# Patient Record
Sex: Female | Born: 1960 | Race: White | Hispanic: No | Marital: Married | Smoking: Never smoker
Health system: Southern US, Community
[De-identification: ages and names within clinical notes are randomized; demographics above are authoritative.]

## PROBLEM LIST (undated history)

## (undated) DIAGNOSIS — R112 Nausea with vomiting, unspecified: Secondary | ICD-10-CM

## (undated) DIAGNOSIS — A419 Sepsis, unspecified organism: Secondary | ICD-10-CM

## (undated) DIAGNOSIS — Z9889 Other specified postprocedural states: Secondary | ICD-10-CM

## (undated) DIAGNOSIS — Z87442 Personal history of urinary calculi: Secondary | ICD-10-CM

## (undated) DIAGNOSIS — N200 Calculus of kidney: Secondary | ICD-10-CM

## (undated) HISTORY — PX: OTHER SURGICAL HISTORY: SHX169

---

## 2004-04-03 ENCOUNTER — Ambulatory Visit: Payer: Self-pay | Admitting: Internal Medicine

## 2005-05-18 ENCOUNTER — Ambulatory Visit: Payer: Self-pay | Admitting: Unknown Physician Specialty

## 2006-05-23 ENCOUNTER — Ambulatory Visit: Payer: Self-pay | Admitting: Unknown Physician Specialty

## 2006-07-26 ENCOUNTER — Ambulatory Visit: Payer: Self-pay | Admitting: Unknown Physician Specialty

## 2007-08-18 ENCOUNTER — Ambulatory Visit: Payer: Self-pay | Admitting: Unknown Physician Specialty

## 2007-11-24 ENCOUNTER — Ambulatory Visit: Payer: Self-pay | Admitting: Urology

## 2007-12-01 ENCOUNTER — Ambulatory Visit: Payer: Self-pay | Admitting: Urology

## 2008-05-10 ENCOUNTER — Ambulatory Visit: Payer: Self-pay | Admitting: Urology

## 2008-10-08 ENCOUNTER — Ambulatory Visit: Payer: Self-pay | Admitting: Urology

## 2009-07-16 ENCOUNTER — Ambulatory Visit: Payer: Self-pay | Admitting: Unknown Physician Specialty

## 2010-09-29 ENCOUNTER — Ambulatory Visit: Payer: Self-pay | Admitting: Internal Medicine

## 2010-09-30 ENCOUNTER — Ambulatory Visit: Payer: Self-pay | Admitting: Internal Medicine

## 2012-06-20 ENCOUNTER — Ambulatory Visit: Payer: Self-pay | Admitting: Internal Medicine

## 2012-11-14 ENCOUNTER — Ambulatory Visit: Payer: Self-pay | Admitting: Internal Medicine

## 2013-04-30 ENCOUNTER — Ambulatory Visit: Payer: Self-pay | Admitting: Internal Medicine

## 2013-10-14 ENCOUNTER — Ambulatory Visit: Payer: Self-pay | Admitting: Internal Medicine

## 2014-07-16 ENCOUNTER — Other Ambulatory Visit: Payer: Self-pay | Admitting: Obstetrics and Gynecology

## 2014-07-16 DIAGNOSIS — Z1231 Encounter for screening mammogram for malignant neoplasm of breast: Secondary | ICD-10-CM

## 2014-07-30 ENCOUNTER — Ambulatory Visit
Admission: RE | Admit: 2014-07-30 | Discharge: 2014-07-30 | Disposition: A | Discharge status: Home / Self Care | Payer: 59 | Source: Ambulatory Visit | Attending: Obstetrics and Gynecology | Admitting: Obstetrics and Gynecology

## 2014-07-30 DIAGNOSIS — Z1231 Encounter for screening mammogram for malignant neoplasm of breast: Secondary | ICD-10-CM | POA: Diagnosis not present

## 2015-09-26 ENCOUNTER — Other Ambulatory Visit: Payer: Self-pay | Admitting: Internal Medicine

## 2015-09-26 DIAGNOSIS — Z1231 Encounter for screening mammogram for malignant neoplasm of breast: Secondary | ICD-10-CM

## 2015-10-10 ENCOUNTER — Other Ambulatory Visit: Payer: Self-pay | Admitting: Internal Medicine

## 2015-10-10 ENCOUNTER — Ambulatory Visit
Admission: RE | Admit: 2015-10-10 | Discharge: 2015-10-10 | Disposition: A | Discharge status: Home / Self Care | Payer: 59 | Source: Ambulatory Visit | Attending: Internal Medicine | Admitting: Internal Medicine

## 2015-10-10 DIAGNOSIS — Z1231 Encounter for screening mammogram for malignant neoplasm of breast: Secondary | ICD-10-CM

## 2016-07-02 DIAGNOSIS — I10 Essential (primary) hypertension: Secondary | ICD-10-CM | POA: Diagnosis not present

## 2016-07-02 DIAGNOSIS — E784 Other hyperlipidemia: Secondary | ICD-10-CM | POA: Diagnosis not present

## 2016-07-05 DIAGNOSIS — K219 Gastro-esophageal reflux disease without esophagitis: Secondary | ICD-10-CM | POA: Diagnosis not present

## 2016-07-09 DIAGNOSIS — L089 Local infection of the skin and subcutaneous tissue, unspecified: Secondary | ICD-10-CM | POA: Diagnosis not present

## 2016-07-09 DIAGNOSIS — S0096XA Insect bite (nonvenomous) of unspecified part of head, initial encounter: Secondary | ICD-10-CM | POA: Diagnosis not present

## 2016-07-09 DIAGNOSIS — W57XXXA Bitten or stung by nonvenomous insect and other nonvenomous arthropods, initial encounter: Secondary | ICD-10-CM | POA: Diagnosis not present

## 2016-09-28 DIAGNOSIS — B309 Viral conjunctivitis, unspecified: Secondary | ICD-10-CM | POA: Diagnosis not present

## 2016-09-28 DIAGNOSIS — K219 Gastro-esophageal reflux disease without esophagitis: Secondary | ICD-10-CM | POA: Diagnosis not present

## 2016-10-11 DIAGNOSIS — Z124 Encounter for screening for malignant neoplasm of cervix: Secondary | ICD-10-CM | POA: Diagnosis not present

## 2016-10-11 DIAGNOSIS — Z131 Encounter for screening for diabetes mellitus: Secondary | ICD-10-CM | POA: Diagnosis not present

## 2016-10-11 DIAGNOSIS — Z136 Encounter for screening for cardiovascular disorders: Secondary | ICD-10-CM | POA: Diagnosis not present

## 2016-10-11 DIAGNOSIS — Z01419 Encounter for gynecological examination (general) (routine) without abnormal findings: Secondary | ICD-10-CM | POA: Diagnosis not present

## 2016-10-11 DIAGNOSIS — Z13220 Encounter for screening for lipoid disorders: Secondary | ICD-10-CM | POA: Diagnosis not present

## 2016-10-14 ENCOUNTER — Other Ambulatory Visit: Payer: Self-pay | Admitting: Obstetrics and Gynecology

## 2016-10-14 DIAGNOSIS — Z1231 Encounter for screening mammogram for malignant neoplasm of breast: Secondary | ICD-10-CM

## 2016-10-14 DIAGNOSIS — Z13820 Encounter for screening for osteoporosis: Secondary | ICD-10-CM

## 2016-11-20 DIAGNOSIS — S83412A Sprain of medial collateral ligament of left knee, initial encounter: Secondary | ICD-10-CM | POA: Diagnosis not present

## 2016-11-20 DIAGNOSIS — S838X2A Sprain of other specified parts of left knee, initial encounter: Secondary | ICD-10-CM | POA: Diagnosis not present

## 2017-01-28 DIAGNOSIS — M179 Osteoarthritis of knee, unspecified: Secondary | ICD-10-CM | POA: Insufficient documentation

## 2017-01-28 DIAGNOSIS — M1710 Unilateral primary osteoarthritis, unspecified knee: Secondary | ICD-10-CM | POA: Insufficient documentation

## 2017-01-28 DIAGNOSIS — M1712 Unilateral primary osteoarthritis, left knee: Secondary | ICD-10-CM | POA: Diagnosis not present

## 2017-03-17 DIAGNOSIS — Z23 Encounter for immunization: Secondary | ICD-10-CM | POA: Diagnosis not present

## 2017-03-24 DIAGNOSIS — K8010 Calculus of gallbladder with chronic cholecystitis without obstruction: Secondary | ICD-10-CM | POA: Diagnosis not present

## 2017-03-24 DIAGNOSIS — J399 Disease of upper respiratory tract, unspecified: Secondary | ICD-10-CM | POA: Diagnosis not present

## 2017-03-24 DIAGNOSIS — R350 Frequency of micturition: Secondary | ICD-10-CM | POA: Diagnosis not present

## 2017-03-29 ENCOUNTER — Other Ambulatory Visit: Payer: Self-pay | Admitting: Cardiology

## 2017-03-29 DIAGNOSIS — R1084 Generalized abdominal pain: Secondary | ICD-10-CM

## 2017-04-01 ENCOUNTER — Ambulatory Visit: Payer: 59

## 2017-04-01 DIAGNOSIS — Z7689 Persons encountering health services in other specified circumstances: Secondary | ICD-10-CM | POA: Diagnosis not present

## 2017-04-08 ENCOUNTER — Ambulatory Visit: Payer: 59

## 2017-04-08 ENCOUNTER — Ambulatory Visit
Admission: RE | Admit: 2017-04-08 | Discharge: 2017-04-08 | Disposition: A | Discharge status: Home / Self Care | Payer: 59 | Source: Ambulatory Visit | Attending: Obstetrics and Gynecology | Admitting: Obstetrics and Gynecology

## 2017-04-08 DIAGNOSIS — Z1231 Encounter for screening mammogram for malignant neoplasm of breast: Secondary | ICD-10-CM | POA: Insufficient documentation

## 2017-04-15 ENCOUNTER — Ambulatory Visit
Admission: RE | Admit: 2017-04-15 | Discharge: 2017-04-15 | Disposition: A | Discharge status: Home / Self Care | Payer: 59 | Source: Ambulatory Visit | Attending: Cardiology | Admitting: Cardiology

## 2017-04-15 DIAGNOSIS — R1084 Generalized abdominal pain: Secondary | ICD-10-CM

## 2017-04-15 DIAGNOSIS — N200 Calculus of kidney: Secondary | ICD-10-CM | POA: Diagnosis not present

## 2017-04-15 DIAGNOSIS — R143 Flatulence: Secondary | ICD-10-CM | POA: Insufficient documentation

## 2017-05-20 DIAGNOSIS — J019 Acute sinusitis, unspecified: Secondary | ICD-10-CM | POA: Diagnosis not present

## 2017-06-24 DIAGNOSIS — K219 Gastro-esophageal reflux disease without esophagitis: Secondary | ICD-10-CM | POA: Diagnosis not present

## 2017-06-24 DIAGNOSIS — J399 Disease of upper respiratory tract, unspecified: Secondary | ICD-10-CM | POA: Diagnosis not present

## 2017-06-24 DIAGNOSIS — R350 Frequency of micturition: Secondary | ICD-10-CM | POA: Diagnosis not present

## 2017-07-01 DIAGNOSIS — E119 Type 2 diabetes mellitus without complications: Secondary | ICD-10-CM | POA: Diagnosis not present

## 2017-07-01 DIAGNOSIS — I209 Angina pectoris, unspecified: Secondary | ICD-10-CM | POA: Diagnosis not present

## 2017-07-07 DIAGNOSIS — K219 Gastro-esophageal reflux disease without esophagitis: Secondary | ICD-10-CM | POA: Diagnosis not present

## 2017-08-19 ENCOUNTER — Ambulatory Visit: Payer: 59 | Admitting: Podiatry

## 2017-08-19 ENCOUNTER — Encounter: Payer: Self-pay | Admitting: Podiatry

## 2017-08-19 ENCOUNTER — Ambulatory Visit (INDEPENDENT_AMBULATORY_CARE_PROVIDER_SITE_OTHER): Payer: 59

## 2017-08-19 DIAGNOSIS — M7751 Other enthesopathy of right foot: Secondary | ICD-10-CM

## 2017-08-19 DIAGNOSIS — M779 Enthesopathy, unspecified: Secondary | ICD-10-CM | POA: Diagnosis not present

## 2017-08-19 DIAGNOSIS — M778 Other enthesopathies, not elsewhere classified: Secondary | ICD-10-CM

## 2017-08-19 MED ORDER — IBUPROFEN 800 MG PO TABS: 800.0000 mg | ORAL_TABLET | Freq: Three times a day (TID) | ORAL | 1 refills | Status: DC | PRN

## 2017-08-22 NOTE — Progress Notes (Signed)
   HPI: 57 year old female presenting today as a new patient with a chief complaint of intermittent aching, throbbing pain that 71began one month ago. Walking and flexing the toes increases the pain. He has been soaking the the foot in Epsom salt and taking Tylenol and Advil for treatment. Patient is here for further evaluation and treatment.   No past medical history on file.   Physical Exam: General: The patient is alert and oriented x3 in no acute distress.  Dermatology: Skin is warm, dry and supple bilateral lower extremities. Negative for open lesions or macerations.  Vascular: Palpable pedal pulses bilaterally. No edema or erythema noted. Capillary refill within normal limits.  Neurological: Epicritic and protective threshold grossly intact bilaterally.   Musculoskeletal Exam: Pain with palpation of the 3rd MPJ of the right foot. Range of motion within normal limits to all pedal and ankle joints bilateral. Muscle strength 5/5 in all groups bilateral.   Radiographic Exam:  Normal osseous mineralization. Joint spaces preserved. No fracture/dislocation/boney destruction.    Assessment: 1. 3rd MPJ capsulitis right    Plan of Care:  1. Patient evaluated. X-Rays reviewed.  2. Injection of 0.5 mLs Celestone Soluspan injected into the 3rd MPJ of the right foot.  3. Prescription for Motrin 800 mg provided to patient.  4. Recommended good shoe gear.  5. Return to clinic in 4 weeks.   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       Felecia ShellingBrent M. Tephanie Escorcia, DPM [facility]  Dr. Felecia ShellingBrent M. Ghina Bittinger, DPM    [address]                                        [place], [place] [address]                Office [phone]  Fax [phone]

## 2017-09-16 ENCOUNTER — Ambulatory Visit: Payer: 59 | Admitting: Podiatry

## 2017-10-04 ENCOUNTER — Ambulatory Visit: Payer: 59 | Admitting: Podiatry

## 2017-10-12 DIAGNOSIS — Z13220 Encounter for screening for lipoid disorders: Secondary | ICD-10-CM | POA: Diagnosis not present

## 2017-10-12 DIAGNOSIS — Z124 Encounter for screening for malignant neoplasm of cervix: Secondary | ICD-10-CM | POA: Diagnosis not present

## 2017-10-12 DIAGNOSIS — Z01419 Encounter for gynecological examination (general) (routine) without abnormal findings: Secondary | ICD-10-CM | POA: Diagnosis not present

## 2017-10-12 DIAGNOSIS — Z136 Encounter for screening for cardiovascular disorders: Secondary | ICD-10-CM | POA: Diagnosis not present

## 2017-10-12 DIAGNOSIS — Z131 Encounter for screening for diabetes mellitus: Secondary | ICD-10-CM | POA: Diagnosis not present

## 2017-10-14 ENCOUNTER — Other Ambulatory Visit: Payer: Self-pay | Admitting: Obstetrics and Gynecology

## 2017-10-14 DIAGNOSIS — Z13820 Encounter for screening for osteoporosis: Secondary | ICD-10-CM

## 2017-10-21 ENCOUNTER — Ambulatory Visit: Payer: 59 | Admitting: Podiatry

## 2017-10-28 ENCOUNTER — Encounter: Payer: Self-pay | Admitting: Podiatry

## 2017-10-28 ENCOUNTER — Ambulatory Visit: Payer: 59 | Admitting: Podiatry

## 2017-10-28 DIAGNOSIS — Z131 Encounter for screening for diabetes mellitus: Secondary | ICD-10-CM | POA: Diagnosis not present

## 2017-10-28 DIAGNOSIS — M779 Enthesopathy, unspecified: Secondary | ICD-10-CM | POA: Diagnosis not present

## 2017-10-28 DIAGNOSIS — Z0184 Encounter for antibody response examination: Secondary | ICD-10-CM | POA: Diagnosis not present

## 2017-10-28 DIAGNOSIS — M7751 Other enthesopathy of right foot: Secondary | ICD-10-CM

## 2017-10-31 NOTE — Progress Notes (Signed)
   HPI: 57 year old female presenting today for follow up evaluation of capsulitis of the 3rd MPJ of the right foot. She states the injection has helped alleviate the pain but she feels as if it is coming back now. She has been taking Motrin for pain. Patient is here for further evaluation and treatment.   No past medical history on file.   Physical Exam: General: The patient is alert and oriented x3 in no acute distress.  Dermatology: Skin is warm, dry and supple bilateral lower extremities. Negative for open lesions or macerations.  Vascular: Palpable pedal pulses bilaterally. No edema or erythema noted. Capillary refill within normal limits.  Neurological: Epicritic and protective threshold grossly intact bilaterally.   Musculoskeletal Exam: Pain with palpation of the 3rd MPJ of the right foot. Range of motion within normal limits to all pedal and ankle joints bilateral. Muscle strength 5/5 in all groups bilateral.   Assessment: 1. 3rd MPJ capsulitis right    Plan of Care:  1. Patient evaluated.  2. Injection of 0.5 mLs Celestone Soluspan injected into the 3rd MPJ of the right foot.  3. Continue taking Motrin 800 mg.  4. Post op shoe dispensed. Weightbearing as tolerated for three weeks.  5. Return to clinic as needed.   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       Felecia ShellingBrent M. Marirose Deveney, DPM [facility]  Dr. Felecia ShellingBrent M. Kaidan Spengler, DPM    [address]                                        [place], [place] [address]                Office [phone]  Fax [phone]

## 2017-12-09 DIAGNOSIS — G4733 Obstructive sleep apnea (adult) (pediatric): Secondary | ICD-10-CM | POA: Diagnosis not present

## 2017-12-09 DIAGNOSIS — E669 Obesity, unspecified: Secondary | ICD-10-CM | POA: Diagnosis not present

## 2017-12-09 DIAGNOSIS — E8881 Metabolic syndrome: Secondary | ICD-10-CM | POA: Diagnosis not present

## 2017-12-30 DIAGNOSIS — E8881 Metabolic syndrome: Secondary | ICD-10-CM | POA: Diagnosis not present

## 2017-12-30 DIAGNOSIS — G4733 Obstructive sleep apnea (adult) (pediatric): Secondary | ICD-10-CM | POA: Diagnosis not present

## 2017-12-30 DIAGNOSIS — E669 Obesity, unspecified: Secondary | ICD-10-CM | POA: Diagnosis not present

## 2018-03-03 DIAGNOSIS — R9431 Abnormal electrocardiogram [ECG] [EKG]: Secondary | ICD-10-CM | POA: Diagnosis not present

## 2018-03-03 DIAGNOSIS — E8881 Metabolic syndrome: Secondary | ICD-10-CM | POA: Diagnosis not present

## 2018-03-31 DIAGNOSIS — R9431 Abnormal electrocardiogram [ECG] [EKG]: Secondary | ICD-10-CM | POA: Diagnosis not present

## 2018-03-31 DIAGNOSIS — E8881 Metabolic syndrome: Secondary | ICD-10-CM | POA: Diagnosis not present

## 2018-10-20 ENCOUNTER — Other Ambulatory Visit: Payer: Self-pay | Admitting: Podiatry

## 2018-11-15 IMAGING — US US ABDOMEN COMPLETE
1 series · 13 of 25 positions shown · non-contrast
Comparison: 09/29/2010

CLINICAL DATA: Generalized abdominal pain for 2 months.

EXAM:
ABDOMEN ULTRASOUND COMPLETE

[Series 1: us abdomen complete · 0.23mm/px · 13 of 98 slices shown]
[im 1/98]
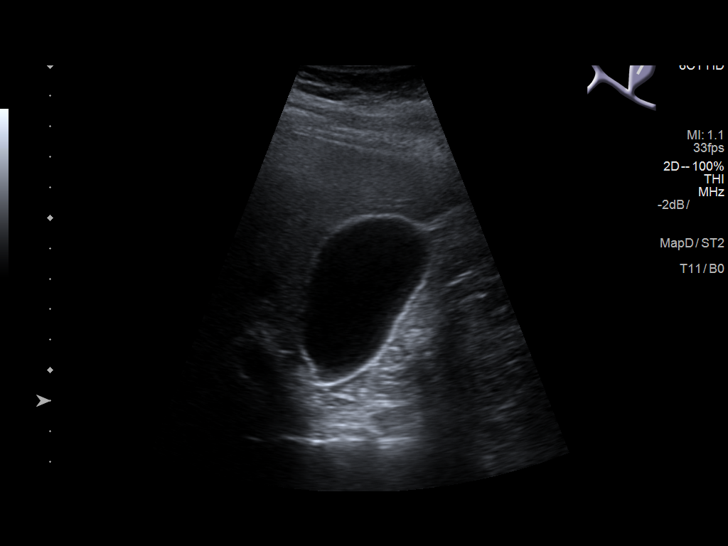
[im 9/98]
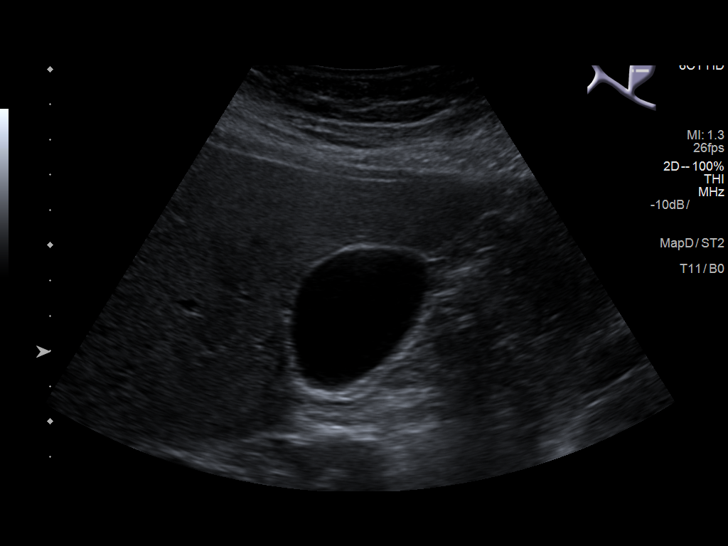
[im 17/98]
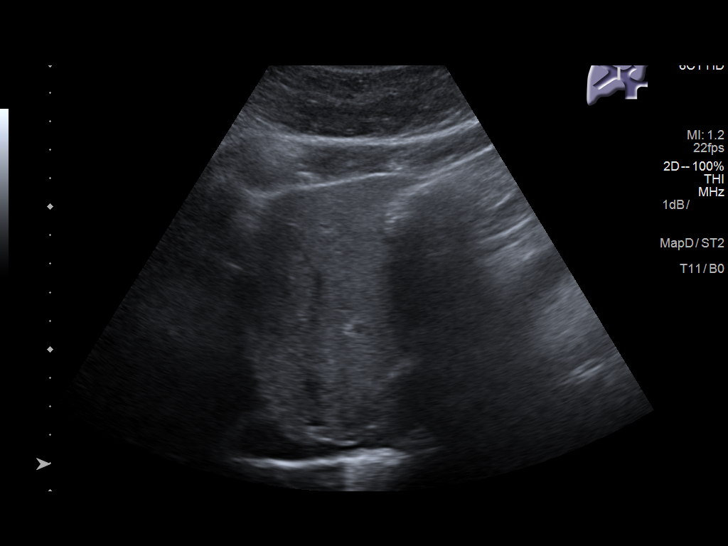
[im 25/98]
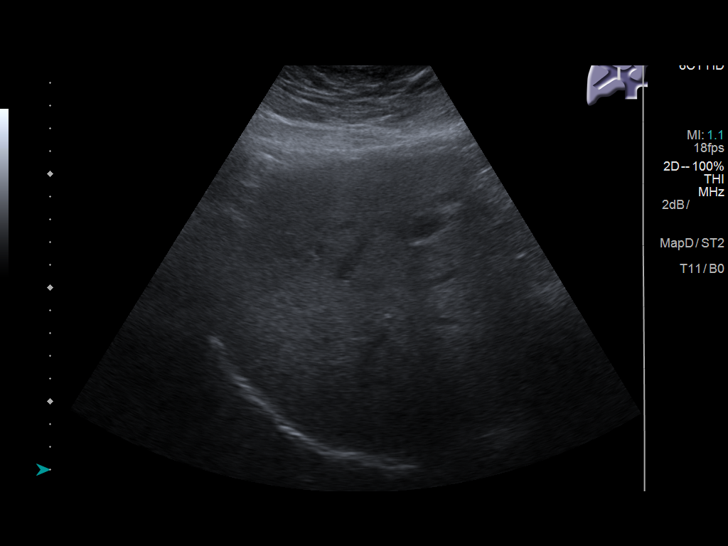
[im 33/98]
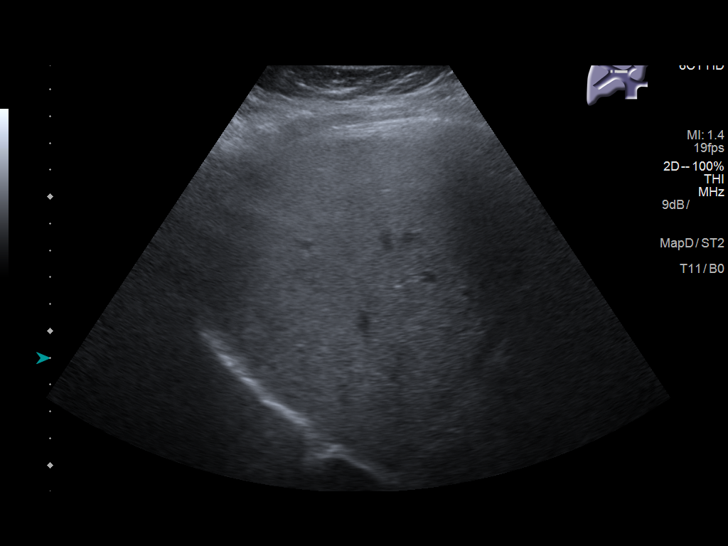
[im 41/98]
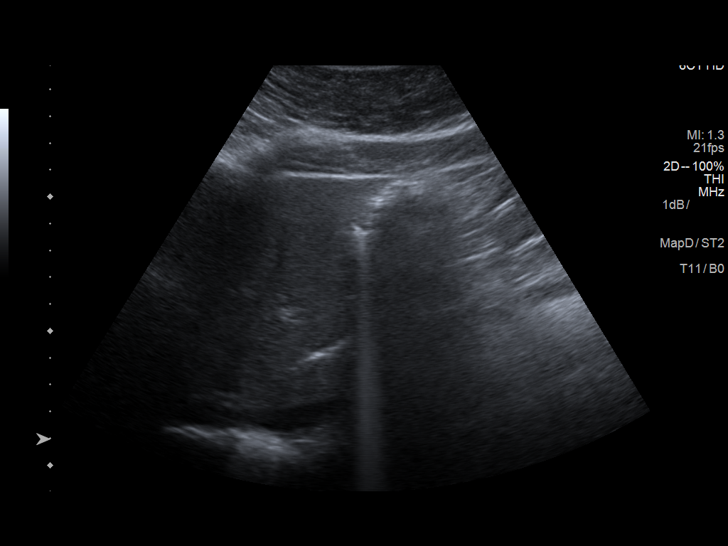
[im 49/98]
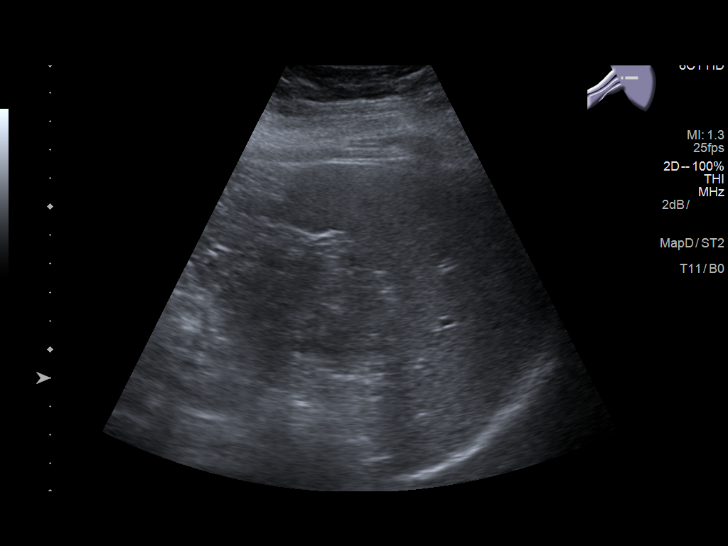
[im 57/98]
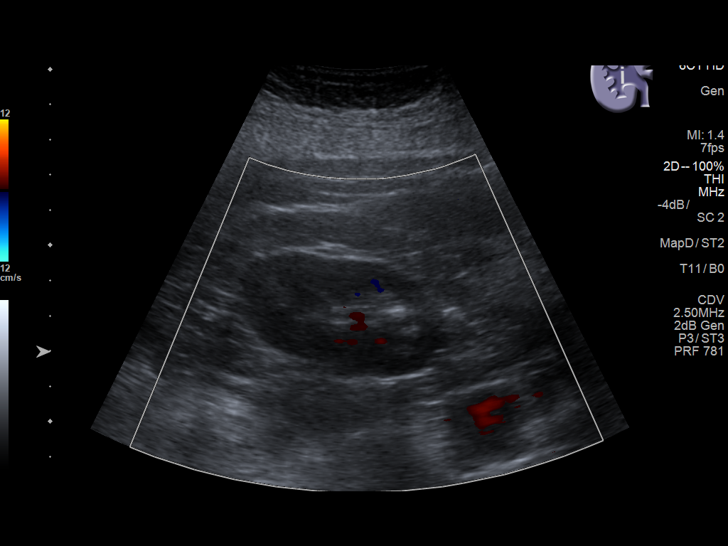
[im 65/98]
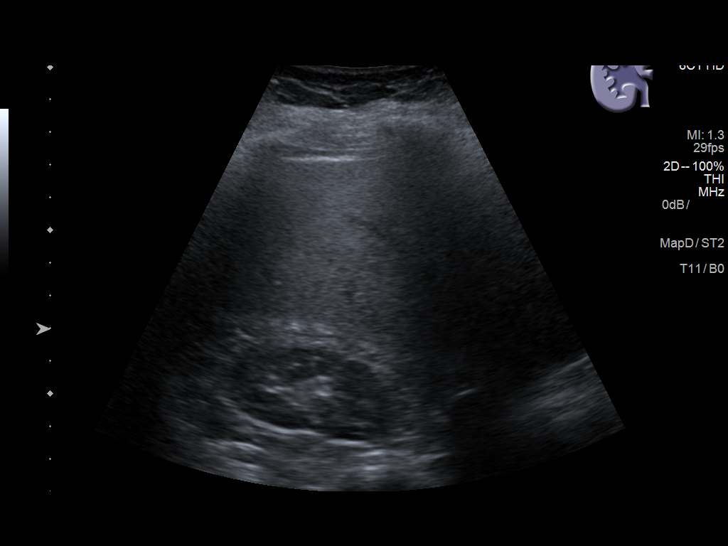
[im 73/98]
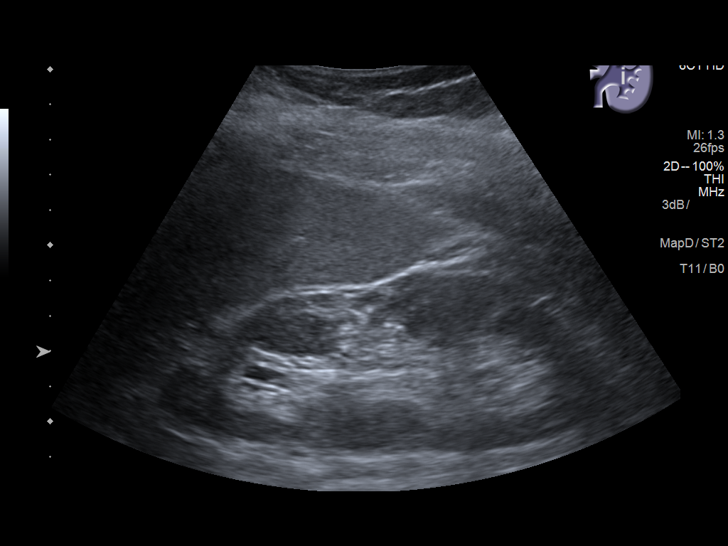
[im 81/98]
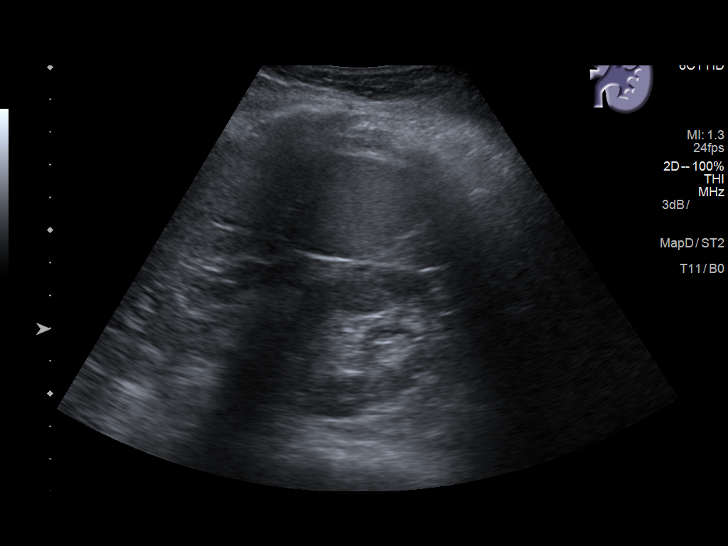
[im 89/98]
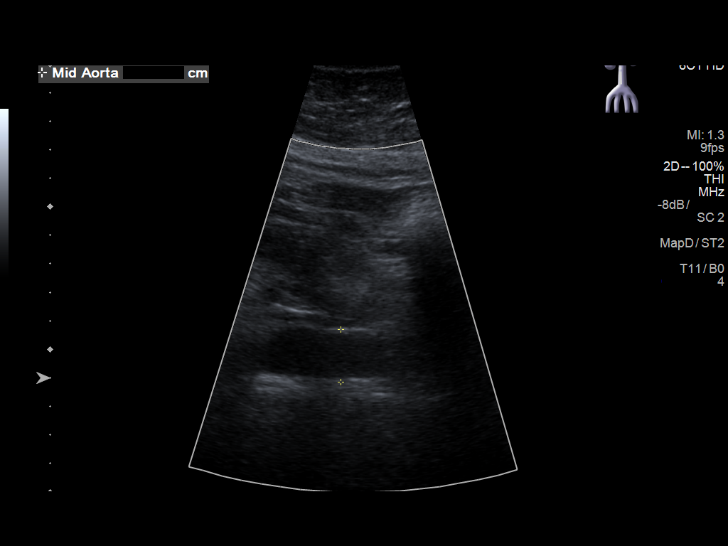
[im 98/98]
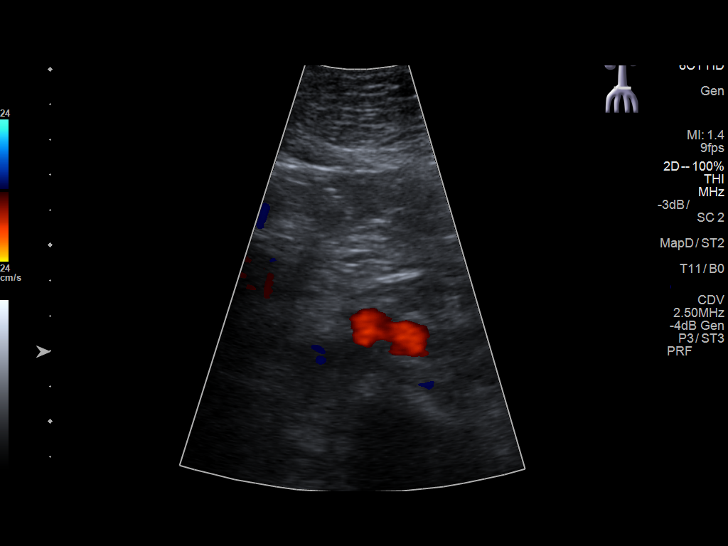

[13 of 25 positions shown; findings below may reference images not displayed]

FINDINGS: Gallbladder: No gallstones or wall thickening visualized. No
sonographic Murphy sign noted by sonographer.

Common bile duct: Diameter: 3 mm

Liver: No focal lesion identified. Within normal limits in
parenchymal echogenicity. Portal vein is patent on color Doppler
imaging with normal direction of blood flow towards the liver.

IVC: No abnormality visualized.

Pancreas: Visualized portion unremarkable.

Spleen: Size and appearance within normal limits.

Right Kidney: Length: 11.9 cm. Echogenicity within normal limits.
Shadowing calculi within the lower pole of the right kidney measures
1.2 cm

Left Kidney: Length: 12.3 cm. Echogenicity within normal limits.
Irregular contour of the left kidney, may be postsurgical. No
evidence of hydronephrosis or nephrolithiasis.

Abdominal aorta: No aneurysm visualized.

Other findings: None.
IMPRESSION: No evidence of acute sonographic abnormalities in the abdomen.

1.2 cm nonobstructive right lower pole renal calculus.

Irregular contour of the left kidney and midpole or cortical
thinning may represent postsurgical scarring.

## 2019-04-02 ENCOUNTER — Other Ambulatory Visit: Payer: Self-pay | Admitting: Internal Medicine

## 2019-04-02 DIAGNOSIS — Z1231 Encounter for screening mammogram for malignant neoplasm of breast: Secondary | ICD-10-CM

## 2019-07-25 ENCOUNTER — Ambulatory Visit (INDEPENDENT_AMBULATORY_CARE_PROVIDER_SITE_OTHER): Payer: 59 | Admitting: Bariatrics

## 2019-08-08 ENCOUNTER — Ambulatory Visit (INDEPENDENT_AMBULATORY_CARE_PROVIDER_SITE_OTHER): Payer: 59 | Admitting: Bariatrics

## 2019-08-09 ENCOUNTER — Ambulatory Visit
Admission: RE | Admit: 2019-08-09 | Discharge: 2019-08-09 | Disposition: A | Discharge status: Home / Self Care | Payer: 59 | Source: Ambulatory Visit | Attending: Internal Medicine | Admitting: Internal Medicine

## 2019-08-09 DIAGNOSIS — Z1231 Encounter for screening mammogram for malignant neoplasm of breast: Secondary | ICD-10-CM | POA: Diagnosis not present

## 2019-10-26 ENCOUNTER — Encounter: Payer: 59 | Admitting: Internal Medicine

## 2019-11-02 ENCOUNTER — Other Ambulatory Visit: Payer: Self-pay

## 2019-11-02 ENCOUNTER — Encounter: Payer: Self-pay | Admitting: Internal Medicine

## 2019-11-02 ENCOUNTER — Ambulatory Visit (INDEPENDENT_AMBULATORY_CARE_PROVIDER_SITE_OTHER): Payer: 59 | Admitting: Internal Medicine

## 2019-11-02 VITALS — BP 133/81 | HR 86 | Ht 64.0 in | Wt 246.5 lb

## 2019-11-02 DIAGNOSIS — M25559 Pain in unspecified hip: Secondary | ICD-10-CM | POA: Diagnosis not present

## 2019-11-02 DIAGNOSIS — Z0000 Encounter for general adult medical examination without abnormal findings: Secondary | ICD-10-CM | POA: Diagnosis not present

## 2019-11-02 DIAGNOSIS — E66813 Obesity, class 3: Secondary | ICD-10-CM

## 2019-11-02 DIAGNOSIS — M172 Bilateral post-traumatic osteoarthritis of knee: Secondary | ICD-10-CM

## 2019-11-02 DIAGNOSIS — E669 Obesity, unspecified: Secondary | ICD-10-CM | POA: Insufficient documentation

## 2019-11-02 DIAGNOSIS — R609 Edema, unspecified: Secondary | ICD-10-CM

## 2019-11-02 DIAGNOSIS — Z6841 Body Mass Index (BMI) 40.0 and over, adult: Secondary | ICD-10-CM

## 2019-11-02 MED ORDER — FUROSEMIDE 20 MG PO TABS: 20.0000 mg | ORAL_TABLET | Freq: Every day | ORAL | 3 refills | Status: DC

## 2019-11-02 NOTE — Assessment & Plan Note (Signed)
This is a chronic problem she was referred to the orthopedic surgeon.  Weight loss will help her and she is trying to weight loss physician to get some of the new medicine which is available for the weight loss

## 2019-11-02 NOTE — Assessment & Plan Note (Signed)
It is stable right now.

## 2019-11-02 NOTE — Progress Notes (Signed)
Established Patient Office Visit  Subjective:  Patient ID: Sherri Byrd, female    DOB: 28-Sep-1960  Age: 59 y.o. MRN: [id number]  CC:  Chief Complaint  Patient presents with  . Annual Exam    HPI  Sherri Byrd presents for obesity and  Swelling  Of legs  No past medical history on file.  No past surgical history on file.  Family History  Problem Relation Age of Onset  . Breast cancer Maternal Aunt 58  . Breast cancer Cousin 3       maternal    Social History   Socioeconomic History  . Marital status: Married    Spouse name: Not on file  . Number of children: Not on file  . Years of education: Not on file  . Highest education level: Not on file  Occupational History  . Not on file  Tobacco Use  . Smoking status: Never Smoker  . Smokeless tobacco: Never Used  Substance and Sexual Activity  . Alcohol use: Not Currently  . Drug use: Never  . Sexual activity: Not on file  Other Topics Concern  . Not on file  Social History Narrative  . Not on file   Social Determinants of Health   Financial Resource Strain:   . Difficulty of Paying Living Expenses:   Food Insecurity:   . Worried About Charity fundraiser in the Last Year:   . Arboriculturist in the Last Year:   Transportation Needs:   . Film/video editor (Medical):   Marland Kitchen Lack of Transportation (Non-Medical):   Physical Activity:   . Days of Exercise per Week:   . Minutes of Exercise per Session:   Stress:   . Feeling of Stress :   Social Connections:   . Frequency of Communication with Friends and Family:   . Frequency of Social Gatherings with Friends and Family:   . Attends Religious Services:   . Active Member of Clubs or Organizations:   . Attends Archivist Meetings:   Marland Kitchen Marital Status:   Intimate Partner Violence:   . Fear of Current or Ex-Partner:   . Emotionally Abused:   Marland Kitchen Physically Abused:   . Sexually Abused:      Current Outpatient Medications:  .  ibuprofen  (ADVIL,MOTRIN) 800 MG tablet, Take 1 tablet (800 mg total) by mouth every 8 (eight) hours as needed., Disp: 60 tablet, Rfl: 1 .  furosemide (LASIX) 20 MG tablet, Take 1 tablet (20 mg total) by mouth daily., Disp: 30 tablet, Rfl: 3   Allergies  Allergen Reactions  . Prednisone Shortness Of Breath    tachycardia  . Meloxicam Other (See Comments)    LE edema  . Sulfa Antibiotics Hives    ROS Review of Systems  Constitutional: Negative.  Negative for chills, fatigue and fever.  HENT: Negative.  Negative for hearing loss, mouth sores, nosebleeds, postnasal drip, sinus pressure, sneezing, tinnitus, trouble swallowing and voice change.   Eyes: Negative.  Negative for discharge.  Respiratory: Negative.  Negative for cough, choking, chest tightness and shortness of breath.   Cardiovascular: Negative.  Negative for chest pain and leg swelling.  Gastrointestinal: Negative.   Endocrine: Negative.   Genitourinary: Negative for dysuria, frequency and urgency.  Musculoskeletal: Negative.   Skin: Negative.   Allergic/Immunologic: Negative.  Negative for food allergies.  Neurological: Negative.  Negative for facial asymmetry, speech difficulty, light-headedness and numbness.  Hematological: Negative.   Psychiatric/Behavioral: Positive for hallucinations.  The patient is not nervous/anxious and is not hyperactive.   All other systems reviewed and are negative.     Objective:    Physical Exam Vitals reviewed.  Constitutional:      Appearance: Normal appearance.  HENT:     Mouth/Throat:     Mouth: Mucous membranes are moist.  Eyes:     Pupils: Pupils are equal, round, and reactive to light.  Neck:     Vascular: No carotid bruit.  Cardiovascular:     Rate and Rhythm: Normal rate and regular rhythm.     Pulses: Normal pulses.     Heart sounds: Normal heart sounds.  Pulmonary:     Effort: Pulmonary effort is normal.     Breath sounds: Normal breath sounds.  Abdominal:     General: Bowel  sounds are normal.     Palpations: Abdomen is soft. There is no hepatomegaly, splenomegaly or mass.     Tenderness: There is no abdominal tenderness.     Hernia: No hernia is present.  Musculoskeletal:        General: No tenderness.     Cervical back: Neck supple.     Right lower leg: Edema present.     Left lower leg: Edema present.  Skin:    Findings: No rash.  Neurological:     Mental Status: She is alert and oriented to person, place, and time.     Motor: No weakness.  Psychiatric:        Mood and Affect: Mood and affect normal.        Behavior: Behavior normal.     BP 133/81   Pulse 86   Ht '5\' 4"'$  (1.626 m)   Wt 246 lb 8 oz (111.8 kg)   BMI 42.31 kg/m  Wt Readings from Last 3 Encounters:  11/02/19 246 lb 8 oz (111.8 kg)     Health Maintenance Due  Topic Date Due  . Hepatitis C Screening  Never done  . COVID-19 Vaccine (1) Never done  . HIV Screening  Never done  . TETANUS/TDAP  Never done  . PAP SMEAR-Modifier  Never done  . COLONOSCOPY  Never done  . INFLUENZA VACCINE  10/21/2019    There are no preventive care reminders to display for this patient.  No results found for: TSH No results found for: WBC, HGB, HCT, MCV, PLT No results found for: NA, K, CHLORIDE, CO2, GLUCOSE, BUN, CREATININE, BILITOT, ALKPHOS, AST, ALT, PROT, ALBUMIN, CALCIUM, ANIONGAP, EGFR, GFR No results found for: CHOL No results found for: HDL No results found for: LDLCALC No results found for: TRIG No results found for: CHOLHDL No results found for: HGBA1C    Assessment & Plan:   Problem List Items Addressed This Visit      Musculoskeletal and Integument   Osteoarthritis of knee    This is a chronic problem she was referred to the orthopedic surgeon.  Weight loss will help her and she is trying to weight loss physician to get some of the new medicine which is available for the weight loss        Other   Hip pain    It is stable right now.      Class 3 severe obesity due to  excess calories without serious comorbidity with body mass index (BMI) of 40.0 to 44.9 in adult [facility])    - I encouraged the patient to lose weight.  - I educated them on making healthy dietary choices including eating  more fruits and vegetables and less fried foods. - I encouraged the patient to exercise more, and educated on the benefits of exercise including weight loss, diabetes prevention, and hypertension prevention.        Edema - Primary    She does not have any tenderness in the calf suggesting DVT.  Peripheral pulses are 2+.  There is no evidence of dermatitis  I think the swelling of the legs is due to venous insufficiency  she was advised to take Lasix 20 mg every other day as needed.   And take over-the-counter potassium pills.   Weight reduction will definitely help control of the swelling.      Relevant Medications   furosemide (LASIX) 20 MG tablet      Meds ordered this encounter  Medications  . furosemide (LASIX) 20 MG tablet    Sig: Take 1 tablet (20 mg total) by mouth daily.    Dispense:  30 tablet    Refill:  3    Follow-up: No follow-ups on file.    Cletis Athens, MD

## 2019-11-02 NOTE — Assessment & Plan Note (Signed)
-   I encouraged the patient to lose weight.  - I educated them on making healthy dietary choices including eating more fruits and vegetables and less fried foods. - I encouraged the patient to exercise more, and educated on the benefits of exercise including weight loss, diabetes prevention, and hypertension prevention.   

## 2019-11-02 NOTE — Assessment & Plan Note (Signed)
She does not have any tenderness in the calf suggesting DVT.  Peripheral pulses are 2+.  There is no evidence of dermatitis  I think the swelling of the legs is due to venous insufficiency  she was advised to take Lasix 20 mg every other day as needed.   And take over-the-counter potassium pills.   Weight reduction will definitely help control of the swelling.

## 2019-11-14 ENCOUNTER — Ambulatory Visit (INDEPENDENT_AMBULATORY_CARE_PROVIDER_SITE_OTHER): Payer: 59 | Admitting: Internal Medicine

## 2019-11-14 ENCOUNTER — Encounter: Payer: Self-pay | Admitting: Internal Medicine

## 2019-11-14 ENCOUNTER — Other Ambulatory Visit: Payer: Self-pay

## 2019-11-14 VITALS — BP 130/87 | HR 82 | Ht 64.0 in | Wt 247.9 lb

## 2019-11-14 DIAGNOSIS — M25559 Pain in unspecified hip: Secondary | ICD-10-CM | POA: Diagnosis not present

## 2019-11-14 DIAGNOSIS — M172 Bilateral post-traumatic osteoarthritis of knee: Secondary | ICD-10-CM | POA: Diagnosis not present

## 2019-11-14 DIAGNOSIS — R600 Localized edema: Secondary | ICD-10-CM

## 2019-11-14 DIAGNOSIS — Z6841 Body Mass Index (BMI) 40.0 and over, adult: Secondary | ICD-10-CM

## 2019-11-14 MED ORDER — PHENTERMINE HCL 15 MG PO CAPS: 15.0000 mg | ORAL_CAPSULE | ORAL | 2 refills | Status: DC

## 2019-11-14 NOTE — Assessment & Plan Note (Signed)
Leg is nontender without any evidence of  DVT

## 2019-11-14 NOTE — Assessment & Plan Note (Signed)
Patient is encouraged to lose weight.  Avoid ansaids as much as possible

## 2019-11-14 NOTE — Assessment & Plan Note (Signed)
-   I encouraged the patient to lose weight.  - I educated them on making healthy dietary choices including eating more fruits and vegetables and less fried foods. - I encouraged the patient to exercise more, and educated on the benefits of exercise including weight loss, diabetes management, and hypertension management.   

## 2019-11-14 NOTE — Assessment & Plan Note (Signed)
Stable at the present time. 

## 2019-11-14 NOTE — Progress Notes (Addendum)
Established Patient Office Visit  SUBJECTIVE:  Subjective  Patient ID: Sherri Byrd, female    DOB: 10-31-1960  Age: 59 y.o. MRN: [id number]  CC:  Chief Complaint  Patient presents with  . Discuss lowering BMI    HPI Sherri Byrd is a 59 y.o. female presenting today to discuss her medications.  Patient want to try different medication to lose weight.   phentermine 15 mg p.o. daily prescription was given.  Her blood pressure is stable so I think she'll be able to tolerate this medicine.  That she will follow her diet.  she wanted to go to the weight loss clinic but cost is prohibitive. Capsulitis of the third MP joint..  In the past.  Also an ultrasound of the  abdomen which did not reveal any aneurysm    History reviewed. No pertinent past medical history.  History reviewed. No pertinent surgical history.  Family History  Problem Relation Age of Onset  . Breast cancer Maternal Aunt 58  . Breast cancer Cousin 55       maternal    Social History   Socioeconomic History  . Marital status: Married    Spouse name: Not on file  . Number of children: Not on file  . Years of education: Not on file  . Highest education level: Not on file  Occupational History  . Not on file  Tobacco Use  . Smoking status: Never Smoker  . Smokeless tobacco: Never Used  Substance and Sexual Activity  . Alcohol use: Not Currently  . Drug use: Never  . Sexual activity: Not on file  Other Topics Concern  . Not on file  Social History Narrative  . Not on file   Social Determinants of Health   Financial Resource Strain:   . Difficulty of Paying Living Expenses: Not on file  Food Insecurity:   . Worried About Charity fundraiser in the Last Year: Not on file  . Ran Out of Food in the Last Year: Not on file  Transportation Needs:   . Lack of Transportation (Medical): Not on file  . Lack of Transportation (Non-Medical): Not on file  Physical Activity:   . Days of Exercise per Week: Not  on file  . Minutes of Exercise per Session: Not on file  Stress:   . Feeling of Stress : Not on file  Social Connections:   . Frequency of Communication with Friends and Family: Not on file  . Frequency of Social Gatherings with Friends and Family: Not on file  . Attends Religious Services: Not on file  . Active Member of Clubs or Organizations: Not on file  . Attends Archivist Meetings: Not on file  . Marital Status: Not on file  Intimate Partner Violence:   . Fear of Current or Ex-Partner: Not on file  . Emotionally Abused: Not on file  . Physically Abused: Not on file  . Sexually Abused: Not on file     Current Outpatient Medications:  .  furosemide (LASIX) 20 MG tablet, Take 1 tablet (20 mg total) by mouth daily., Disp: 30 tablet, Rfl: 3 .  ibuprofen (ADVIL,MOTRIN) 800 MG tablet, Take 1 tablet (800 mg total) by mouth every 8 (eight) hours as needed., Disp: 60 tablet, Rfl: 1 .  phentermine 15 MG capsule, Take 1 capsule (15 mg total) by mouth every morning., Disp: 30 capsule, Rfl: 2   Allergies  Allergen Reactions  . Prednisone Shortness Of Breath  tachycardia  . Meloxicam Other (See Comments)    LE edema  . Sulfa Antibiotics Hives    ROS Review of Systems  Constitutional: Negative.   HENT: Negative.   Eyes: Negative.   Respiratory: Negative.   Cardiovascular: Negative.   Gastrointestinal: Negative.   Endocrine: Negative.   Genitourinary: Negative.   Musculoskeletal: Negative.   Skin: Negative.   Allergic/Immunologic: Negative.   Neurological: Negative.   Hematological: Negative.   Psychiatric/Behavioral: Negative.   All other systems reviewed and are negative.    OBJECTIVE:    Physical Exam Vitals reviewed.  Constitutional:      Appearance: Normal appearance.  HENT:     Mouth/Throat:     Mouth: Mucous membranes are moist.  Eyes:     Pupils: Pupils are equal, round, and reactive to light.  Neck:     Vascular: No carotid bruit.    Cardiovascular:     Rate and Rhythm: Normal rate and regular rhythm.     Pulses: Normal pulses.     Heart sounds: Normal heart sounds.  Pulmonary:     Effort: Pulmonary effort is normal.     Breath sounds: Normal breath sounds.  Abdominal:     General: Bowel sounds are normal.     Palpations: Abdomen is soft. There is no hepatomegaly, splenomegaly or mass.     Tenderness: There is no abdominal tenderness.     Hernia: No hernia is present.  Musculoskeletal:        General: No tenderness.     Cervical back: Neck supple.     Right lower leg: No edema.     Left lower leg: Edema present.  Skin:    Findings: No rash.  Neurological:     Mental Status: She is alert and oriented to person, place, and time.     Motor: No weakness.  Psychiatric:        Mood and Affect: Mood and affect normal.        Behavior: Behavior normal.     BP 130/87   Pulse 82   Ht $R'5\' 4"'NW$  (1.626 m)   Wt 247 lb 14.4 oz (112.4 kg)   BMI 42.55 kg/m  Wt Readings from Last 3 Encounters:  11/14/19 247 lb 14.4 oz (112.4 kg)  11/02/19 246 lb 8 oz (111.8 kg)    Health Maintenance Due  Topic Date Due  . Hepatitis C Screening  Never done  . COVID-19 Vaccine (1) Never done  . HIV Screening  Never done  . TETANUS/TDAP  Never done  . PAP SMEAR-Modifier  Never done  . COLONOSCOPY  Never done  . INFLUENZA VACCINE  10/21/2019    There are no preventive care reminders to display for this patient.  No flowsheet data found. No flowsheet data found.  No results found for: TSH No results found for: ALBUMIN, ANIONGAP, EGFR, GFR No results found for: CHOL, HDL, LDLCALC, CHOLHDL No results found for: TRIG No results found for: HGBA1C    ASSESSMENT & PLAN:   Problem List Items Addressed This Visit      Musculoskeletal and Integument   Osteoarthritis of knee    Patient is encouraged to lose weight.  Avoid ansaids as much as possible        Other   Hip pain    Stable at the present time.      Class 3  severe obesity due to excess calories without serious comorbidity with body mass index (BMI) of 40.0 to 44.9 in  adult [facility]) - Primary    - I encouraged the patient to lose weight.  - I educated them on making healthy dietary choices including eating more fruits and vegetables and less fried foods. - I encouraged the patient to exercise more, and educated on the benefits of exercise including weight loss, diabetes management, and hypertension management.        Relevant Medications   phentermine 15 MG capsule   Edema    Leg is nontender without any evidence of  DVT         Meds ordered this encounter  Medications  . phentermine 15 MG capsule    Sig: Take 1 capsule (15 mg total) by mouth every morning.    Dispense:  30 capsule    Refill:  2     Follow-up: No follow-ups on file.    Dr. Jane Canary [facility] [address], [place], [place] [address]   By signing my name below, I, [employer], attest that this documentation has been prepared under the direction of Cletis Athens, MD. Electronically Signed: Cletis Athens, MD 11/14/19, 4:41 PM   I personally performed the services described in this documentation, which was SCRIBED in my presence. The recorded information has been reviewed and considered accurate. It has been edited as necessary during review. Cletis Athens, MD

## 2019-12-07 ENCOUNTER — Ambulatory Visit: Payer: 59 | Admitting: Internal Medicine

## 2019-12-28 ENCOUNTER — Other Ambulatory Visit: Payer: Self-pay

## 2020-05-16 ENCOUNTER — Ambulatory Visit: Payer: 59 | Admitting: Family Medicine

## 2020-05-16 ENCOUNTER — Encounter: Payer: Self-pay | Admitting: Family Medicine

## 2020-05-16 ENCOUNTER — Other Ambulatory Visit: Payer: Self-pay

## 2020-05-16 VITALS — BP 147/84 | HR 96 | Ht 66.0 in | Wt 226.1 lb

## 2020-05-16 DIAGNOSIS — M79672 Pain in left foot: Secondary | ICD-10-CM | POA: Insufficient documentation

## 2020-05-16 MED ORDER — IBUPROFEN 600 MG PO TABS
600.0000 mg | ORAL_TABLET | Freq: Three times a day (TID) | ORAL | 0 refills | Status: DC | PRN
Start: 2020-05-16 — End: 2020-09-16

## 2020-05-16 NOTE — Progress Notes (Signed)
Established Patient Office Visit  SUBJECTIVE:  Subjective  Patient ID: Sherri Byrd, female    DOB: 10-14-1960  Age: 60 y.o. MRN: [id number]  CC:  Chief Complaint  Patient presents with  . left foot pain    Patient having left foot pain x 1 week. Hurts on back of heel. Causing it to be hard to walk.     HPI Sherri Byrd is a 60 y.o. female presenting today for     No past medical history on file.  No past surgical history on file.  Family History  Problem Relation Age of Onset  . Breast cancer Maternal Aunt 58  . Breast cancer Cousin 102       maternal    Social History   Socioeconomic History  . Marital status: Married    Spouse name: Not on file  . Number of children: Not on file  . Years of education: Not on file  . Highest education level: Not on file  Occupational History  . Not on file  Tobacco Use  . Smoking status: Never Smoker  . Smokeless tobacco: Never Used  Substance and Sexual Activity  . Alcohol use: Not Currently  . Drug use: Never  . Sexual activity: Not on file  Other Topics Concern  . Not on file  Social History Narrative  . Not on file   Social Determinants of Health   Financial Resource Strain: Not on file  Food Insecurity: Not on file  Transportation Needs: Not on file  Physical Activity: Not on file  Stress: Not on file  Social Connections: Not on file  Intimate Partner Violence: Not on file     Current Outpatient Medications:  .  ibuprofen (ADVIL) 600 MG tablet, Take 1 tablet (600 mg total) by mouth every 8 (eight) hours as needed., Disp: 30 tablet, Rfl: 0 .  furosemide (LASIX) 20 MG tablet, Take 1 tablet (20 mg total) by mouth daily., Disp: 30 tablet, Rfl: 3 .  ibuprofen (ADVIL,MOTRIN) 800 MG tablet, Take 1 tablet (800 mg total) by mouth every 8 (eight) hours as needed., Disp: 60 tablet, Rfl: 1 .  phentermine 15 MG capsule, Take 1 capsule (15 mg total) by mouth every morning., Disp: 30 capsule, Rfl: 2   Allergies   Allergen Reactions  . Prednisone Shortness Of Breath    tachycardia  . Meloxicam Other (See Comments)    LE edema  . Sulfa Antibiotics Hives    ROS Review of Systems  Constitutional: Negative.   HENT: Negative.   Respiratory: Negative.   Cardiovascular: Negative.   Genitourinary: Negative.   Musculoskeletal: Positive for arthralgias and gait problem.  Psychiatric/Behavioral: Negative.      OBJECTIVE:    Physical Exam Vitals and nursing note reviewed.  Constitutional:      Appearance: She is obese.  HENT:     Head: Normocephalic.     Right Ear: Tympanic membrane normal.     Left Ear: Tympanic membrane normal.  Cardiovascular:     Rate and Rhythm: Normal rate and regular rhythm.     Pulses: Normal pulses.  Abdominal:     General: Abdomen is flat.  Skin:    General: Skin is warm.     Capillary Refill: Capillary refill takes less than 2 seconds.     Comments: Left heel pain, calcaneal   Neurological:     General: No focal deficit present.     Mental Status: She is alert.     BP Marland Kitchen)  147/84   Pulse 96   Ht _0  (1.676 m)   Wt 226 lb 1.6 oz (102.6 kg)   BMI 36.49 kg/m  Wt Readings from Last 3 Encounters:  05/16/20 226 lb 1.6 oz (102.6 kg)  11/14/19 247 lb 14.4 oz (112.4 kg)  11/02/19 246 lb 8 oz (111.8 kg)    Health Maintenance Due  Topic Date Due  . Hepatitis C Screening  Never done  . COVID-19 Vaccine (1) Never done  . HIV Screening  Never done  . TETANUS/TDAP  Never done  . PAP SMEAR-Modifier  Never done  . COLONOSCOPY (Pts 45-22yr Insurance coverage will need to be confirmed)  Never done  . INFLUENZA VACCINE  Never done    There are no preventive care reminders to display for this patient.  No flowsheet data found. No flowsheet data found.  No results found for: TSH No results found for: ALBUMIN, ANIONGAP, EGFR, GFR No results found for: CHOL, HDL, LDLCALC, CHOLHDL No results found for: TRIG No results found for: HGBA1C    ASSESSMENT &  PLAN:   Problem List Items Addressed This Visit      Other   Pain of left heel - Primary    Patient with left heel pain x 3 days, no trauma reported. Pain to the touch and with walking.  Plan- She has made a podiatry appt for two weeks, in the meantime I will give Motrin 600 mg tid and Ice tid for 3-5 days.            Meds ordered this encounter  Medications  . ibuprofen (ADVIL) 600 MG tablet    Sig: Take 1 tablet (600 mg total) by mouth every 8 (eight) hours as needed.    Dispense:  30 tablet    Refill:  0      Follow-up: No follow-ups on file.    KBeckie Salts [place], [place] [place] [id number]

## 2020-05-16 NOTE — Assessment & Plan Note (Signed)
Patient with left heel pain x 3 days, no trauma reported. Pain to the touch and with walking.  Plan- She has made a podiatry appt for two weeks, in the meantime I will give Motrin 600 mg tid and Ice tid for 3-5 days.

## 2020-05-23 ENCOUNTER — Ambulatory Visit: Payer: 59 | Admitting: Podiatry

## 2020-06-24 ENCOUNTER — Ambulatory Visit (INDEPENDENT_AMBULATORY_CARE_PROVIDER_SITE_OTHER): Payer: 59

## 2020-06-24 ENCOUNTER — Other Ambulatory Visit: Payer: Self-pay

## 2020-06-24 ENCOUNTER — Ambulatory Visit: Payer: 59 | Admitting: Podiatry

## 2020-06-24 DIAGNOSIS — M7662 Achilles tendinitis, left leg: Secondary | ICD-10-CM

## 2020-06-24 DIAGNOSIS — M7732 Calcaneal spur, left foot: Secondary | ICD-10-CM | POA: Diagnosis not present

## 2020-06-24 MED ORDER — BETAMETHASONE SOD PHOS & ACET 6 (3-3) MG/ML IJ SUSP
3.0000 mg | Freq: Once | INTRAMUSCULAR | Status: AC
  Administered 2020-06-24: 3 mg via INTRA_ARTICULAR

## 2020-06-24 NOTE — Progress Notes (Signed)
   HPI: 60 y.o. female presenting today for new complaint regarding left heel pain is been going on for the past 2 months.  She denies a history of injury.  Gradual onset.  It is very achy and painful with walking and first steps in the morning.  She has been taking ibuprofen with minimal relief.  No past medical history on file.    Physical Exam: General: The patient is alert and oriented x3 in no acute distress.  Dermatology: Skin is warm, dry and supple bilateral lower extremities. Negative for open lesions or macerations.  Vascular: Palpable pedal pulses bilaterally. No edema or erythema noted. Capillary refill within normal limits.  Neurological: Epicritic and protective threshold grossly intact bilaterally.   Musculoskeletal Exam: Pain on palpation noted to the posterior tubercle of the left calcaneus at the insertion of the Achilles tendon consistent with retrocalcaneal bursitis. Range of motion within normal limits. Muscle strength 5/5 in all muscle groups bilateral lower extremities.  Radiographic Exam:  Posterior calcaneal spur noted to the respective calcaneus on lateral view. No fracture or dislocation noted. Normal osseous mineralization noted.     Assessment: 1. Insertional Achilles tendinitis left 2. Retrocalcaneal bursitis 3.  Posterior heel spur left   Plan of Care:  1. Patient was evaluated. Radiographs were reviewed today. 2. Injection of 0.5 mL Celestone Soluspan injected into the retrocalcaneal bursa. Care was taken to avoid direct injection into the Achilles tendon. 3.  Continue OTC Motrin 800 mg as needed 4.  Patient allergic to prednisone; hives. 5.  Recommend good shoe gear that does not irritate the posterior heel and shoes with a slight heel lift 6.  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  Fighting for custody of her 59 year old granddaughter   Felecia Shelling, DPM [facility]  Dr. Felecia Shelling,  DPM    [address] [place], [place] [address]                Office [phone]  Fax [phone]

## 2020-07-17 ENCOUNTER — Ambulatory Visit: Payer: 59 | Admitting: Internal Medicine

## 2020-07-25 ENCOUNTER — Ambulatory Visit: Payer: 59 | Admitting: Podiatry

## 2020-08-20 ENCOUNTER — Other Ambulatory Visit: Payer: Self-pay | Admitting: Internal Medicine

## 2020-08-20 DIAGNOSIS — Z1231 Encounter for screening mammogram for malignant neoplasm of breast: Secondary | ICD-10-CM

## 2020-09-04 ENCOUNTER — Encounter: Payer: 59 | Admitting: Family Medicine

## 2020-09-16 ENCOUNTER — Other Ambulatory Visit: Payer: Self-pay | Admitting: *Deleted

## 2020-09-16 MED ORDER — IBUPROFEN 600 MG PO TABS: 600.0000 mg | ORAL_TABLET | Freq: Three times a day (TID) | ORAL | 0 refills | Status: DC | PRN

## 2020-09-19 ENCOUNTER — Ambulatory Visit: Payer: 59 | Admitting: Podiatry

## 2020-09-29 ENCOUNTER — Other Ambulatory Visit: Payer: Self-pay | Admitting: *Deleted

## 2020-09-29 DIAGNOSIS — Z0000 Encounter for general adult medical examination without abnormal findings: Secondary | ICD-10-CM

## 2020-09-30 ENCOUNTER — Ambulatory Visit: Payer: 59 | Admitting: Podiatry

## 2020-09-30 ENCOUNTER — Ambulatory Visit: Payer: 59 | Admitting: Internal Medicine

## 2020-09-30 ENCOUNTER — Other Ambulatory Visit: Payer: Self-pay

## 2020-09-30 ENCOUNTER — Encounter: Payer: Self-pay | Admitting: Internal Medicine

## 2020-09-30 VITALS — BP 128/84 | HR 70 | Ht 66.0 in | Wt 215.7 lb

## 2020-09-30 DIAGNOSIS — M79672 Pain in left foot: Secondary | ICD-10-CM

## 2020-09-30 DIAGNOSIS — M7662 Achilles tendinitis, left leg: Secondary | ICD-10-CM | POA: Diagnosis not present

## 2020-09-30 DIAGNOSIS — M172 Bilateral post-traumatic osteoarthritis of knee: Secondary | ICD-10-CM | POA: Diagnosis not present

## 2020-09-30 DIAGNOSIS — R600 Localized edema: Secondary | ICD-10-CM

## 2020-09-30 DIAGNOSIS — M25559 Pain in unspecified hip: Secondary | ICD-10-CM

## 2020-09-30 DIAGNOSIS — Z6841 Body Mass Index (BMI) 40.0 and over, adult: Secondary | ICD-10-CM

## 2020-09-30 DIAGNOSIS — M7732 Calcaneal spur, left foot: Secondary | ICD-10-CM

## 2020-09-30 LAB — CBC WITH DIFFERENTIAL/PLATELET
Basophils Absolute: 0.1 10*3/uL (ref 0.0–0.2)
Basos: 1 %
EOS (ABSOLUTE): 0.1 10*3/uL (ref 0.0–0.4)
Eos: 1 %
Hematocrit: 37.7 % (ref 34.0–46.6)
Hemoglobin: 13 g/dL (ref 11.1–15.9)
Immature Grans (Abs): 0 10*3/uL (ref 0.0–0.1)
Immature Granulocytes: 0 %
Lymphocytes Absolute: 1.5 10*3/uL (ref 0.7–3.1)
Lymphs: 21 %
MCH: 31.3 pg (ref 26.6–33.0)
MCHC: 34.5 g/dL (ref 31.5–35.7)
MCV: 91 fL (ref 79–97)
Monocytes Absolute: 0.5 10*3/uL (ref 0.1–0.9)
Monocytes: 7 %
Neutrophils Absolute: 5.2 10*3/uL (ref 1.4–7.0)
Neutrophils: 70 %
Platelets: 267 10*3/uL (ref 150–450)
RBC: 4.16 x10E6/uL (ref 3.77–5.28)
RDW: 12 % (ref 11.7–15.4)
WBC: 7.4 10*3/uL (ref 3.4–10.8)

## 2020-09-30 LAB — LIPID PANEL
Chol/HDL Ratio: 2.6 ratio (ref 0.0–4.4)
Cholesterol, Total: 168 mg/dL (ref 100–199)
HDL: 64 mg/dL (ref >39)
LDL Chol Calc (NIH): 88 mg/dL (ref 0–99)
Triglycerides: 86 mg/dL (ref 0–149)
VLDL Cholesterol Cal: 16 mg/dL (ref 5–40)

## 2020-09-30 LAB — TSH: TSH: 1.95 u[IU]/mL (ref 0.450–4.500)

## 2020-09-30 MED ORDER — BETAMETHASONE SOD PHOS & ACET 6 (3-3) MG/ML IJ SUSP: 3.0000 mg | Freq: Once | INTRAMUSCULAR | Status: DC

## 2020-09-30 MED ORDER — IBUPROFEN 800 MG PO TABS: 800.0000 mg | ORAL_TABLET | Freq: Three times a day (TID) | ORAL | 1 refills | Status: DC

## 2020-09-30 NOTE — Assessment & Plan Note (Signed)
Is due to osteoarthritis patient is using orthopedic surgeon for evaluation

## 2020-09-30 NOTE — Progress Notes (Signed)
   HPI: 60 y.o. female presenting today for new complaint regarding left Achilles tendinitis with a posterior heel spur.  Patient states that the last injection she received helped significantly alleviate her pain and symptoms for quite a few months.  Slowly the pain is begun to come back.  She is requesting another injection and refill on her ibuprofen 800 mg.  No past medical history on file.    Physical Exam: General: The patient is alert and oriented x3 in no acute distress.  Dermatology: Skin is warm, dry and supple bilateral lower extremities. Negative for open lesions or macerations.  Vascular: Palpable pedal pulses bilaterally. No edema or erythema noted. Capillary refill within normal limits.  Neurological: Epicritic and protective threshold grossly intact bilaterally.   Musculoskeletal Exam: Pain on palpation noted to the posterior tubercle of the left calcaneus at the insertion of the Achilles tendon consistent with retrocalcaneal bursitis. Range of motion within normal limits. Muscle strength 5/5 in all muscle groups bilateral lower extremities.  Assessment: 1. Insertional Achilles tendinitis left 2. Retrocalcaneal bursitis 3.  Posterior heel spur left   Plan of Care:  1. Patient was evaluated.  2. Injection of 0.5 mL Celestone Soluspan injected into the retrocalcaneal bursa. Care was taken to avoid direct injection into the Achilles tendon. 3.  Continue OTC Motrin 800 mg as needed.  Refill provided 4.  Patient allergic to prednisone; hives. 5.  Recommend good shoe gear that does not irritate the posterior heel and shoes with a slight heel lift 6.  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 Felecia Shelling, DPM    [address] [place], [place] [address]                Office [phone]  Fax [phone]

## 2020-09-30 NOTE — Assessment & Plan Note (Signed)

## 2020-09-30 NOTE — Progress Notes (Signed)
Established Patient Office Visit  Subjective:  Patient ID: Sherri Byrd, female    DOB: [birth year]  Age: 60 y.o. MRN: [id number]  CC:  Chief Complaint  Patient presents with   Follow-up    HPI  Sherri Byrd presents for obesity, patient has lost at least 35 to 40 pounds of weight since last visit has lost, she denies any chest pain shortness of breath and feeling better she is walking 20 minutes every day  History reviewed. No pertinent past medical history.  History reviewed. No pertinent surgical history.  Family History  Problem Relation Age of Onset   Breast cancer Maternal Aunt 70   Breast cancer Cousin 77       maternal    Social History   Socioeconomic History   Marital status: Married    Spouse name: Not on file   Number of children: Not on file   Years of education: Not on file   Highest education level: Not on file  Occupational History   Not on file  Tobacco Use   Smoking status: Never   Smokeless tobacco: Never  Substance and Sexual Activity   Alcohol use: Not Currently   Drug use: Never   Sexual activity: Not on file  Other Topics Concern   Not on file  Social History Narrative   Not on file   Social Determinants of Health   Financial Resource Strain: Not on file  Food Insecurity: Not on file  Transportation Needs: Not on file  Physical Activity: Not on file  Stress: Not on file  Social Connections: Not on file  Intimate Partner Violence: Not on file     Current Outpatient Medications:    furosemide (LASIX) 20 MG tablet, Take 1 tablet (20 mg total) by mouth daily., Disp: 30 tablet, Rfl: 3   ibuprofen (ADVIL) 800 MG tablet, Take 1 tablet (800 mg total) by mouth 3 (three) times daily., Disp: 90 tablet, Rfl: 1  Current Facility-Administered Medications:    betamethasone acetate-betamethasone sodium phosphate (CELESTONE) injection 3 mg, 3 mg, Intra-articular, Once, Evans, Dorathy Daft, DPM   Allergies  Allergen Reactions   Prednisone  Shortness Of Breath    tachycardia   Meloxicam Other (See Comments)    LE edema   Penicillin G Hives   Sulfa Antibiotics Hives    ROS Review of Systems  Constitutional: Negative.   HENT: Negative.    Eyes: Negative.   Respiratory: Negative.    Cardiovascular: Negative.   Gastrointestinal: Negative.   Endocrine: Negative.   Genitourinary: Negative.   Musculoskeletal: Negative.   Skin: Negative.   Allergic/Immunologic: Negative.   Neurological: Negative.   Hematological: Negative.   Psychiatric/Behavioral: Negative.    All other systems reviewed and are negative.    Objective:    Physical Exam Vitals reviewed.  Constitutional:      Appearance: Normal appearance.  HENT:     Mouth/Throat:     Mouth: Mucous membranes are moist.  Eyes:     Pupils: Pupils are equal, round, and reactive to light.  Neck:     Vascular: No carotid bruit.  Cardiovascular:     Rate and Rhythm: Normal rate and regular rhythm.     Pulses: Normal pulses.     Heart sounds: Normal heart sounds.  Pulmonary:     Effort: Pulmonary effort is normal.     Breath sounds: Normal breath sounds.  Abdominal:     General: Bowel sounds are normal.     Palpations:  Abdomen is soft. There is no hepatomegaly, splenomegaly or mass.     Tenderness: There is no abdominal tenderness.     Hernia: No hernia is present.  Musculoskeletal:        General: No tenderness.     Cervical back: Neck supple.     Right lower leg: No edema.     Left lower leg: No edema.  Skin:    Findings: No rash.  Neurological:     Mental Status: She is alert and oriented to person, place, and time.     Motor: No weakness.  Psychiatric:        Mood and Affect: Mood and affect normal.        Behavior: Behavior normal.    BP 128/84   Pulse 70   Ht _0  (1.676 m)   Wt 215 lb 11.2 oz (97.8 kg)   BMI 34.81 kg/m  Wt Readings from Last 3 Encounters:  09/30/20 215 lb 11.2 oz (97.8 kg)  05/16/20 226 lb 1.6 oz (102.6 kg)  11/14/19  247 lb 14.4 oz (112.4 kg)     Health Maintenance Due  Topic Date Due   COVID-19 Vaccine (1) Never done   HIV Screening  Never done   Hepatitis C Screening  Never done   TETANUS/TDAP  Never done   PAP SMEAR-Modifier  Never done   COLONOSCOPY (Pts 45-40yr Insurance coverage will need to be confirmed)  Never done   Zoster Vaccines- Shingrix (1 of 2) Never done    There are no preventive care reminders to display for this patient.  Lab Results  Component Value Date   TSH 1.950 09/29/2020   Lab Results  Component Value Date   WBC 7.4 09/29/2020   HGB 13.0 09/29/2020   HCT 37.7 09/29/2020   MCV 91 09/29/2020   PLT 267 09/29/2020   No results found for: NA, K, CHLORIDE, CO2, GLUCOSE, BUN, CREATININE, BILITOT, ALKPHOS, AST, ALT, PROT, ALBUMIN, CALCIUM, ANIONGAP, EGFR, GFR Lab Results  Component Value Date   CHOL 168 09/29/2020   Lab Results  Component Value Date   HDL 64 09/29/2020   Lab Results  Component Value Date   LDLCALC 88 09/29/2020   Lab Results  Component Value Date   TRIG 86 09/29/2020   Lab Results  Component Value Date   CHOLHDL 2.6 09/29/2020   No results found for: HGBA1C    Assessment & Plan:   Problem List Items Addressed This Visit       Musculoskeletal and Integument   Osteoarthritis of knee - Primary    Is due to osteoarthritis patient is using orthopedic surgeon for evaluation         Other   Hip pain    Is much better since she has lost a lot of weight       Class 3 severe obesity due to excess calories without serious comorbidity with body mass index (BMI) of 40.0 to 44.9 in adult ([facility]    - I encouraged the patient to lose weight.  - I educated them on making healthy dietary choices including eating more fruits and vegetables and less fried foods. - I encouraged the patient to exercise more, and educated on the benefits of exercise including weight loss, diabetes prevention, and hypertension prevention.   Dietary counseling  with a registered dietician  Referral to a weight management support group (e.g. Weight Watchers, Overeaters Anonymous)  If your BMI is greater than 29 or you have gained more  than 15 pounds you should work on weight loss.  Attend a healthy cooking class        Edema    Edema is resolved       Pain of left heel    Patient having plantar fasciitis and she is already received a shot and doing better        No orders of the defined types were placed in this encounter.   Follow-up: No follow-ups on file.    Cletis Athens, MD

## 2020-09-30 NOTE — Assessment & Plan Note (Signed)
Is much better since she has lost a lot of weight

## 2020-09-30 NOTE — Assessment & Plan Note (Signed)
Patient having plantar fasciitis and she is already received a shot and doing better

## 2020-09-30 NOTE — Assessment & Plan Note (Signed)
Edema is resolved

## 2020-10-02 ENCOUNTER — Ambulatory Visit
Admission: RE | Admit: 2020-10-02 | Discharge: 2020-10-02 | Disposition: A | Discharge status: Home / Self Care | Payer: 59 | Source: Ambulatory Visit | Attending: Internal Medicine | Admitting: Internal Medicine

## 2020-10-02 ENCOUNTER — Other Ambulatory Visit: Payer: Self-pay

## 2020-10-02 DIAGNOSIS — Z1231 Encounter for screening mammogram for malignant neoplasm of breast: Secondary | ICD-10-CM | POA: Insufficient documentation

## 2020-10-20 ENCOUNTER — Other Ambulatory Visit: Payer: Self-pay | Admitting: Internal Medicine

## 2020-10-20 DIAGNOSIS — N6489 Other specified disorders of breast: Secondary | ICD-10-CM

## 2020-10-20 DIAGNOSIS — R928 Other abnormal and inconclusive findings on diagnostic imaging of breast: Secondary | ICD-10-CM

## 2020-12-30 ENCOUNTER — Ambulatory Visit: Payer: 59 | Admitting: Podiatry

## 2020-12-30 ENCOUNTER — Other Ambulatory Visit: Payer: Self-pay

## 2020-12-30 DIAGNOSIS — M7662 Achilles tendinitis, left leg: Secondary | ICD-10-CM

## 2020-12-30 MED ORDER — BETAMETHASONE SOD PHOS & ACET 6 (3-3) MG/ML IJ SUSP
3.0000 mg | Freq: Once | INTRAMUSCULAR | Status: AC
  Administered 2020-12-30: 3 mg via INTRA_ARTICULAR

## 2020-12-30 NOTE — Progress Notes (Signed)
   HPI: 60 y.o. female presenting today for pain and tenderness to the left Achilles tendon.  She was last seen in the office on 09/30/2020 where injection was performed to the medial aspect of the left posterior calcaneus.  Patient states that the medial aspect is feeling very well and the injection was very effective.  She is now having pain to the lateral aspect of the left lower extremity posterior tubercle.  She presents for further treatment and evaluation  No past medical history on file.    Physical Exam: General: The patient is alert and oriented x3 in no acute distress.  Dermatology: Skin is warm, dry and supple bilateral lower extremities. Negative for open lesions or macerations.  Vascular: Palpable pedal pulses bilaterally. No edema or erythema noted. Capillary refill within normal limits.  Neurological: Epicritic and protective threshold grossly intact bilaterally.   Musculoskeletal Exam: Pain on palpation noted to the posterior tubercle of the left calcaneus at the insertion of the Achilles tendon lateral aspect consistent with retrocalcaneal bursitis. Range of motion within normal limits. Muscle strength 5/5 in all muscle groups bilateral lower extremities.  Assessment: 1. Insertional Achilles tendinitis left; lateral aspect 2. Retrocalcaneal bursitis 3.  Posterior heel spur left   Plan of Care:  1. Patient was evaluated.  2. Injection of 0.5 mL Celestone Soluspan injected into the retrocalcaneal bursa lateral aspect. Care was taken to avoid direct injection into the Achilles tendon. 3.  Continue OTC Motrin 800 mg as needed. 4.  Patient allergic to prednisone; hives. 5.  Recommend good shoe gear that does not irritate the posterior heel and shoes with a slight heel lift 6.  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 Felecia Shelling, DPM    [address] [place], [place] [address]                Office [phone]  Fax [phone]

## 2021-04-11 IMAGING — MG DIGITAL SCREENING BILAT W/ TOMO W/ CAD
8 series · 8 of 24 positions shown · non-contrast
Comparison: Previous exam(s).

CLINICAL DATA: Screening.

EXAM:
DIGITAL SCREENING BILATERAL MAMMOGRAM WITH TOMO AND CAD

[R MLO synth-2D]
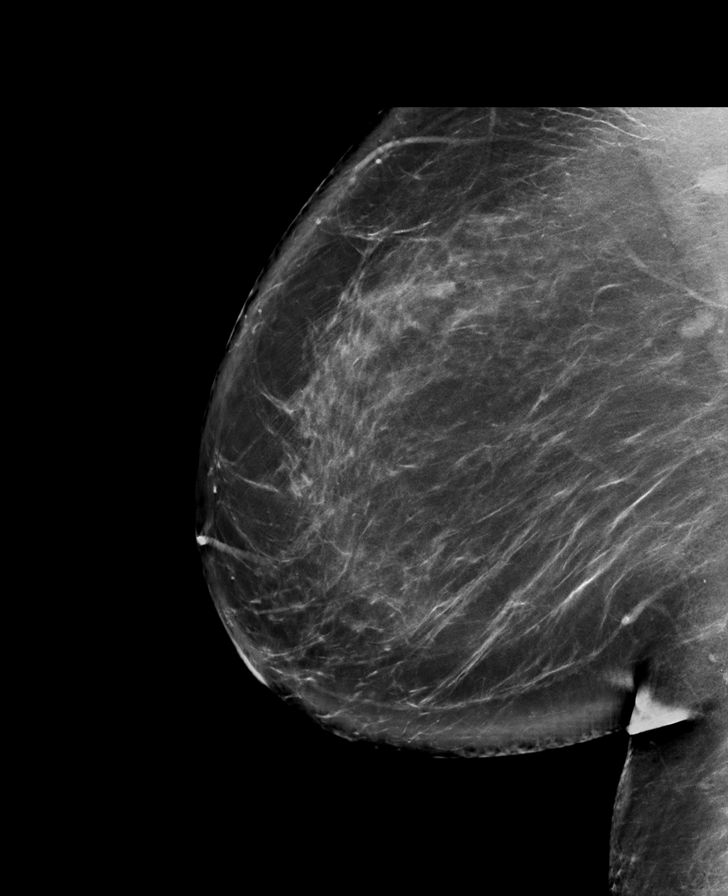

[R CC synth-2D]
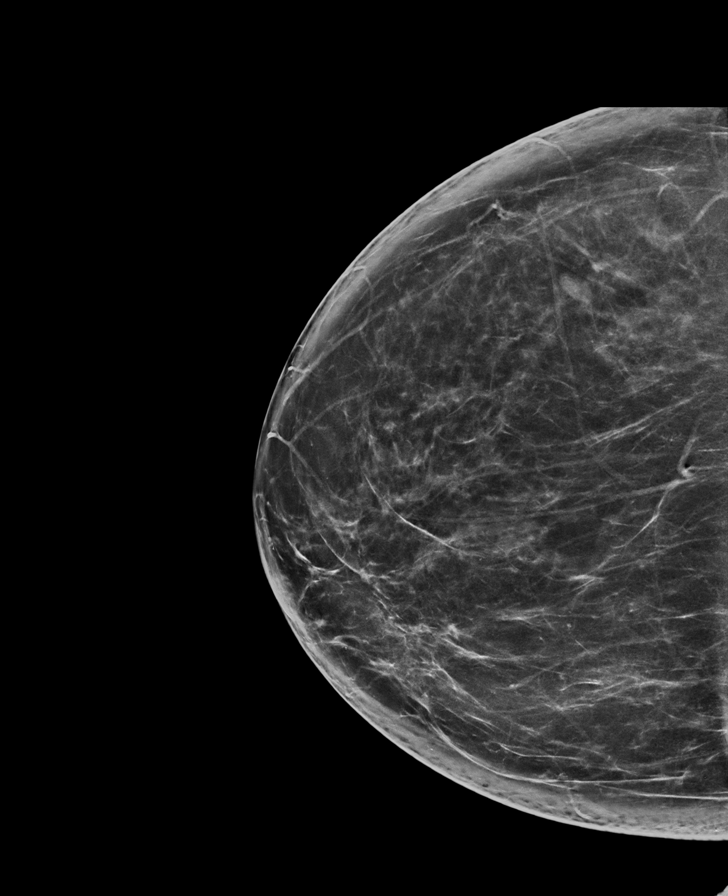

[L CC synth-2D]
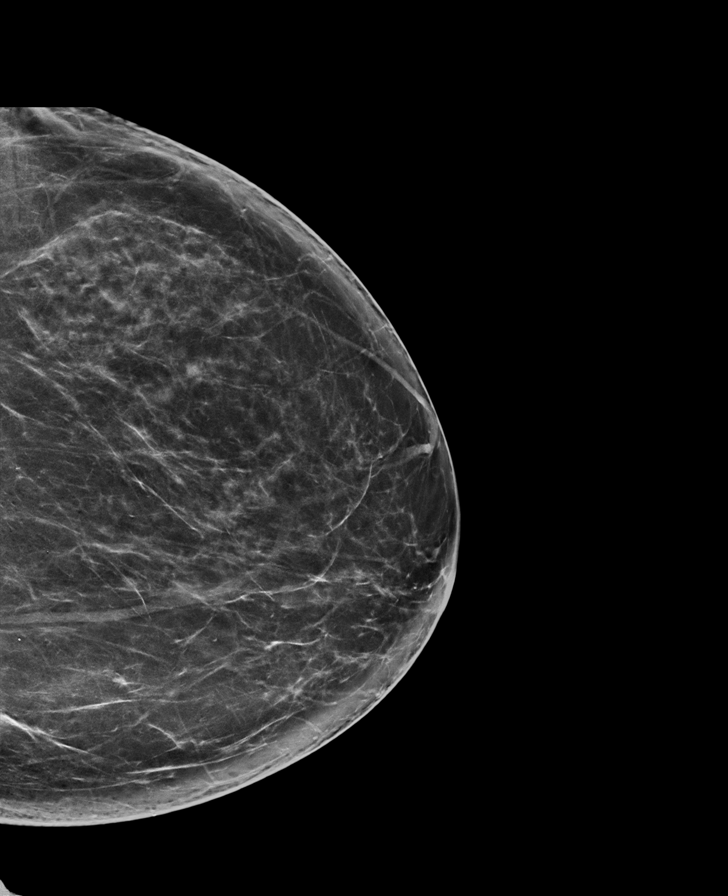

[L MLO synth-2D]
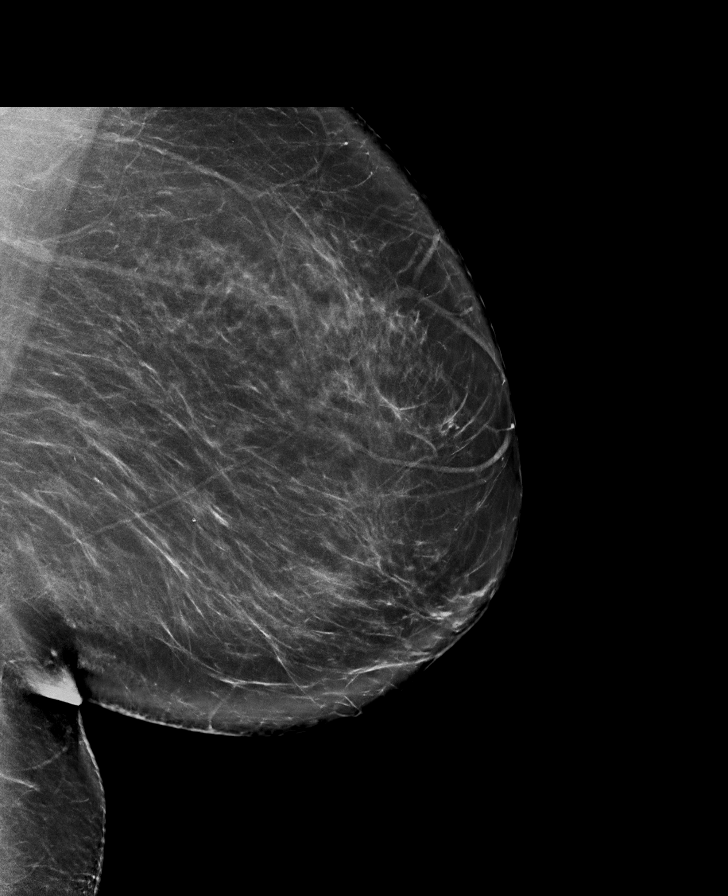

[R CC tomo · tomo slice 45/89.0]
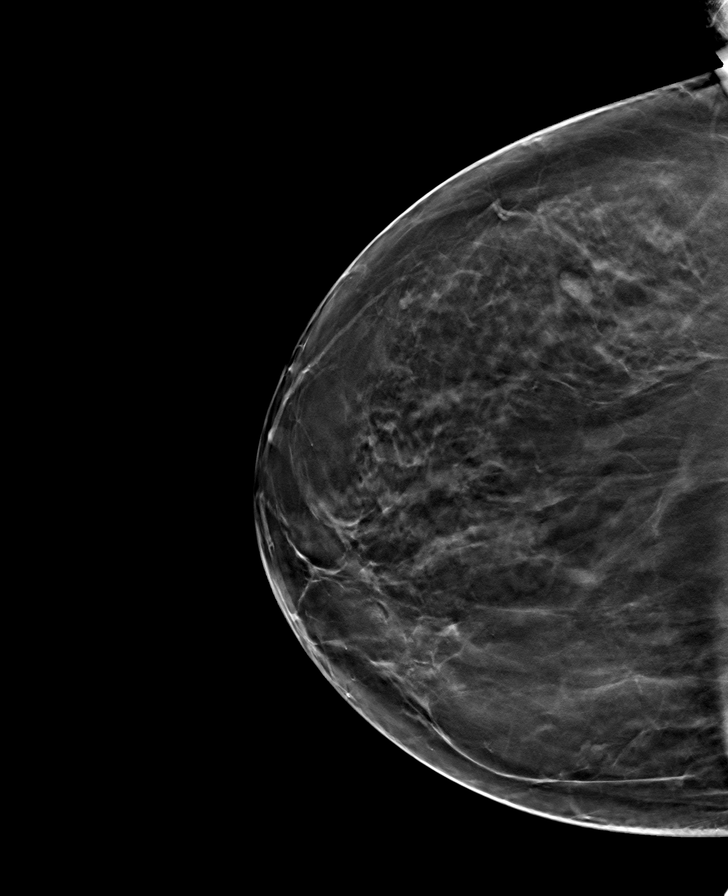

[L MLO tomo · tomo slice 49/97.0]
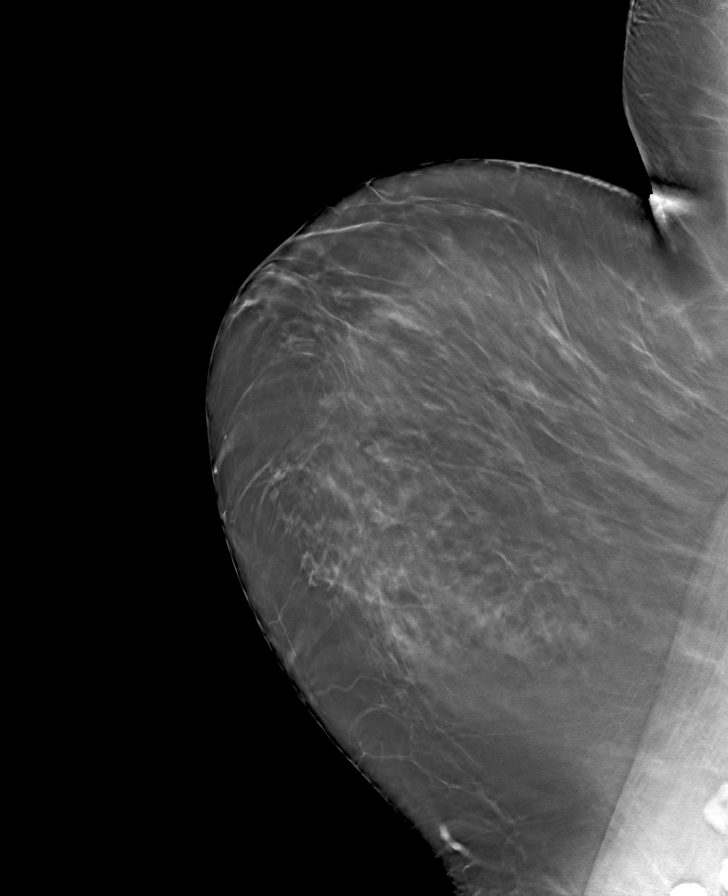

[R MLO tomo · tomo slice 53/104.0]
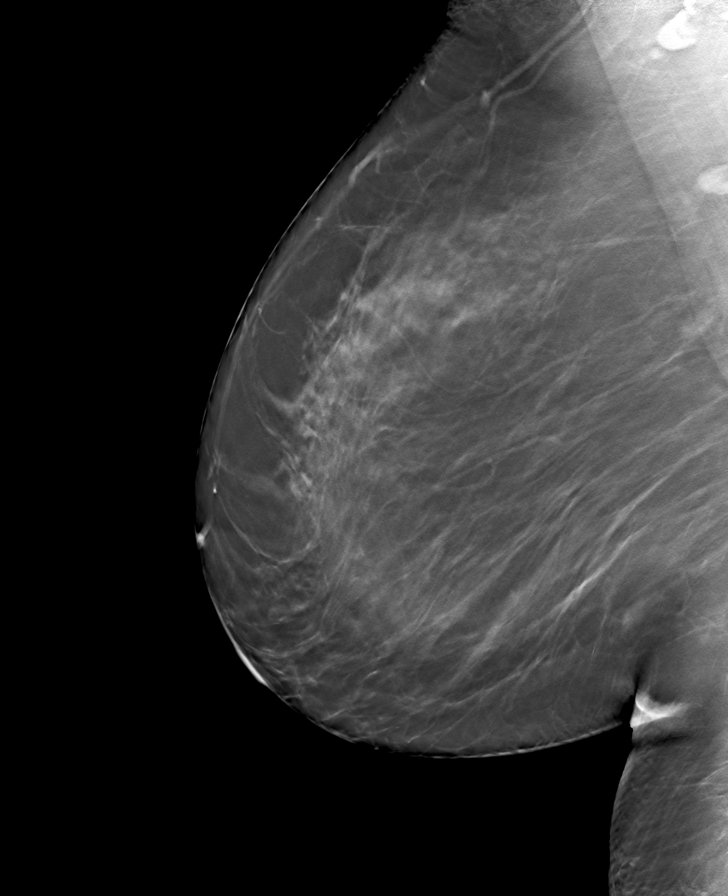

[L CC tomo · tomo slice 45/88.0]
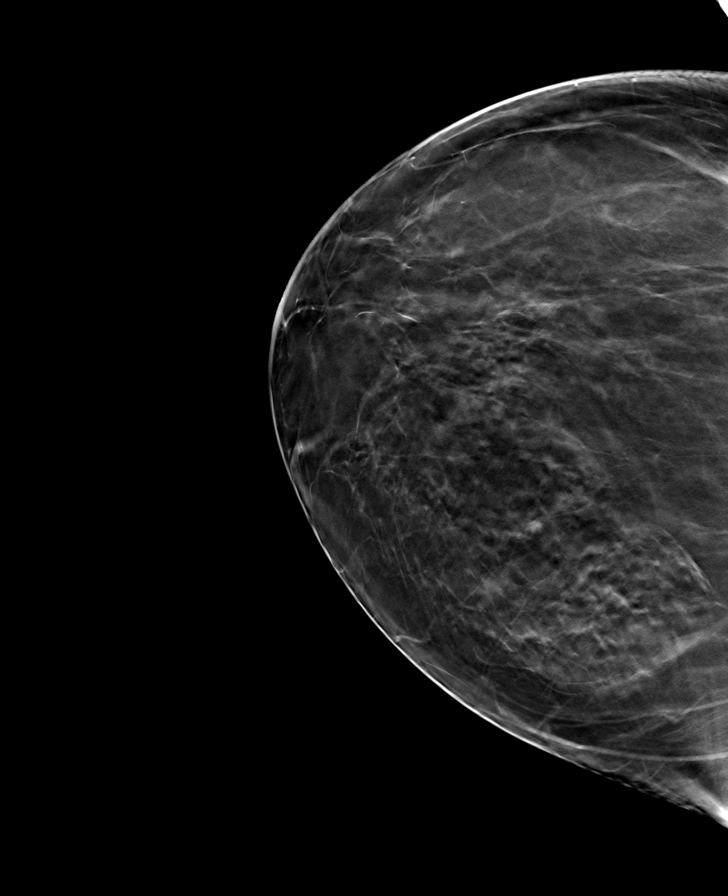

[8 of 24 positions shown; findings below may reference images not displayed]

ACR Breast Density Category b: There are scattered areas of
fibroglandular density.
FINDINGS: There are no findings suspicious for malignancy. Images were
processed with CAD.
IMPRESSION: No mammographic evidence of malignancy. A result letter of this
screening mammogram will be mailed directly to the patient.

RECOMMENDATION:
Screening mammogram in one year. (Code:[id number])

BI-RADS CATEGORY  1: Negative.

## 2021-04-26 ENCOUNTER — Ambulatory Visit (INDEPENDENT_AMBULATORY_CARE_PROVIDER_SITE_OTHER): Payer: 59

## 2021-04-26 ENCOUNTER — Other Ambulatory Visit: Payer: Self-pay

## 2021-04-26 ENCOUNTER — Ambulatory Visit
Admission: EM | Admit: 2021-04-26 | Discharge: 2021-04-26 | Disposition: A | Discharge status: Home / Self Care | Payer: 59 | Attending: Family Medicine | Admitting: Family Medicine

## 2021-04-26 ENCOUNTER — Encounter: Payer: Self-pay | Admitting: Emergency Medicine

## 2021-04-26 DIAGNOSIS — W19XXXA Unspecified fall, initial encounter: Secondary | ICD-10-CM | POA: Diagnosis not present

## 2021-04-26 DIAGNOSIS — M1712 Unilateral primary osteoarthritis, left knee: Secondary | ICD-10-CM

## 2021-04-26 DIAGNOSIS — S8992XA Unspecified injury of left lower leg, initial encounter: Secondary | ICD-10-CM | POA: Diagnosis not present

## 2021-04-26 DIAGNOSIS — M25562 Pain in left knee: Secondary | ICD-10-CM | POA: Diagnosis not present

## 2021-04-26 HISTORY — DX: Calculus of kidney: N20.0

## 2021-04-26 NOTE — ED Provider Notes (Signed)
[facility] Sherri Byrd    CSN: [id number] Arrival date & time: 04/26/21  1011      History   Chief Complaint Chief Complaint  Patient presents with   Knee Injury    HPI Sherri Byrd is a 61 y.o. female.   HPI Patient presents today with left knee pain following a trip down the stairs yesterday. Patient reports twisting her left knee as a result of the injury. She endorses pain, swelling, and would like imaging to rule out any acute injury related to twisting injury. She took Ibuprofen 800 mg around 2 am today with temporary relief. Past Medical History:  Diagnosis Date   Kidney stone     Patient Active Problem List   Diagnosis Date Noted   Pain of left heel 05/16/2020   Hip pain 11/02/2019   Class 3 severe obesity due to excess calories without serious comorbidity with body mass index (BMI) of 40.0 to 44.9 in adult [facility]) 11/02/2019   Edema 11/02/2019   Osteoarthritis of knee 01/28/2017    History reviewed. No pertinent surgical history.  OB History   No obstetric history on file.      Home Medications    Prior to Admission medications   Medication Sig Start Date End Date Taking? Authorizing Provider  cyanocobalamin (,VITAMIN B-12,) 1000 MCG/ML injection Inject into the muscle.    [provider]  furosemide (LASIX) 20 MG tablet Take 1 tablet (20 mg total) by mouth daily. 11/02/19   Corky Downs, MD  ibuprofen (ADVIL) 800 MG tablet Take 1 tablet (800 mg total) by mouth 3 (three) times daily. 09/30/20   Felecia Shelling, DPM  Phendimetrazine Tartrate 35 MG TABS Take 1 tablet by mouth 3 (three) times daily. 11/28/20   [provider]    Family History Family History  Problem Relation Age of Onset   Breast cancer Maternal Aunt 4   Breast cancer Cousin 90       maternal    Social History Social History   Tobacco Use   Smoking status: Never   Smokeless tobacco: Never  Vaping Use   Vaping Use: Never used  Substance Use Topics   Alcohol use:  Not Currently   Drug use: Never     Allergies   Prednisone, Meloxicam, Penicillin g, and Sulfa antibiotics   Review of Systems Review of Systems Pertinent negatives listed in HPI   Physical Exam Triage Vital Signs ED Triage Vitals  Enc Vitals Group     BP 04/26/21 1046 119/65     Pulse Rate 04/26/21 1046 91     Resp 04/26/21 1046 16     Temp 04/26/21 1046 98.3 F (36.8 C)     Temp Source 04/26/21 1046 Oral     SpO2 04/26/21 1046 96 %     Weight --      Height --      Head Circumference --      Peak Flow --      Pain Score 04/26/21 1044 8     Pain Loc --      Pain Edu? --      Excl. in GC? --    No data found.  Updated Vital Signs BP 119/65 (BP Location: Left Arm)    Pulse 91    Temp 98.3 F (36.8 C) (Oral)    Resp 16    SpO2 96%   Visual Acuity Right Eye Distance:   Left Eye Distance:   Bilateral Distance:  Right Eye Near:   Left Eye Near:    Bilateral Near:     Physical Exam Constitutional:      Appearance: She is obese.  HENT:     Head: Normocephalic and atraumatic.  Cardiovascular:     Rate and Rhythm: Normal rate.  Pulmonary:     Effort: Pulmonary effort is normal.  Musculoskeletal:     Left knee: Swelling present. No deformity. Normal range of motion.  Skin:    General: Skin is warm and dry.     Capillary Refill: Capillary refill takes less than 2 seconds.  Neurological:     General: No focal deficit present.     Mental Status: She is alert and oriented to person, place, and time.  Psychiatric:        Attention and Perception: Attention normal.        Mood and Affect: Mood normal.        Speech: Speech normal.     UC Treatments / Results  Labs (all labs ordered are listed, but only abnormal results are displayed) Labs Reviewed - No data to display  EKG   Radiology DG Knee Complete 4 Views Left  Result Date: 04/26/2021 CLINICAL DATA:  Left knee pain for 1 day after a fall. EXAM: LEFT KNEE - COMPLETE 4+ VIEW COMPARISON:  None.  FINDINGS: No fracture. No subluxation or dislocation. Loss of joint space noted in the medial compartment with mild hypertrophic spurring noted in all 3 compartments. Lateral film shows no evidence for joint effusion. No worrisome lytic or sclerotic osseous abnormality. IMPRESSION: Tricompartmental degenerative changes without acute bony abnormality. Electronically Signed   By: Kennith Center M.D.   On: 04/26/2021 10:58    Procedures Procedures (including critical care time)  Medications Ordered in UC Medications - No data to display  Initial Impression / Assessment and Plan / UC Course  I have reviewed the triage vital signs and the nursing notes.  Pertinent labs & imaging results that were available during my care of the patient were reviewed by me and considered in my medical decision making (see chart for details).    Imaging significant for osteoarthritis involving the left knee with imaging indicating some loss of joint space at the medial compartment. Discussed with patient that she will require some follow-up with orthopedics to continue to monitor left knee because this condition advances some people do require knee replacements.  In the meantime patient should perform RICE and continue compression wear with any weightbearing activity. Continue Ibuprofen. Follow-up with orthopedics.  Return clinic PRN Final Clinical Impressions(s) / UC Diagnoses   Final diagnoses:  Tricompartment osteoarthritis of left knee  Injury of left knee, initial encounter     Discharge Instructions      Continue home ibuprofen. Apply ICE to knee for the next 72 hours and elevate to reduce swelling. Follow-up with [facility] for further work up of arthritic knee joints seen on xray today   ED Prescriptions   None    PDMP not reviewed this encounter.   Bing Neighbors, FNP 04/26/21 1124

## 2021-04-26 NOTE — ED Triage Notes (Signed)
Pt injured left knee walking down step yesterday.

## 2021-04-26 NOTE — Discharge Instructions (Addendum)
Continue home ibuprofen. Apply ICE to knee for the next 72 hours and elevate to reduce swelling. Follow-up with [facility] for further work up of arthritic knee joints seen on xray today

## 2021-05-01 ENCOUNTER — Ambulatory Visit: Payer: 59 | Admitting: Podiatry

## 2021-05-01 ENCOUNTER — Encounter: Payer: Self-pay | Admitting: Podiatry

## 2021-05-01 ENCOUNTER — Other Ambulatory Visit: Payer: Self-pay

## 2021-05-01 DIAGNOSIS — M7662 Achilles tendinitis, left leg: Secondary | ICD-10-CM | POA: Diagnosis not present

## 2021-05-01 MED ORDER — BETAMETHASONE SOD PHOS & ACET 6 (3-3) MG/ML IJ SUSP
3.0000 mg | Freq: Once | INTRAMUSCULAR | Status: AC
  Administered 2021-05-01: 3 mg via INTRA_ARTICULAR

## 2021-05-01 NOTE — Addendum Note (Signed)
Addended by: Felecia Shelling on: 05/01/2021 11:18 AM   Modules accepted: Orders

## 2021-05-01 NOTE — Progress Notes (Signed)
° °  HPI: 61 y.o. female presenting today for pain and tenderness to the left Achilles tendon.  Patient states that the last injection she received helped for several months.  Only last week she began to notice pain and tenderness to the posterior heel.  She takes OTC Motrin 800 mg as needed.  She presents for further treatment and evaluation  Past Medical History:  Diagnosis Date   Kidney stone       Physical Exam: General: The patient is alert and oriented x3 in no acute distress.  Dermatology: Skin is warm, dry and supple bilateral lower extremities. Negative for open lesions or macerations.  Vascular: Palpable pedal pulses bilaterally. No edema or erythema noted. Capillary refill within normal limits.  Neurological: Epicritic and protective threshold grossly intact bilaterally.   Musculoskeletal Exam: Pain on palpation noted to the posterior tubercle of the left calcaneus at the insertion of the Achilles tendon lateral aspect consistent with retrocalcaneal bursitis. Range of motion within normal limits. Muscle strength 5/5 in all muscle groups bilateral lower extremities.  Assessment: 1. Insertional Achilles tendinitis left; lateral aspect 2. Retrocalcaneal bursitis 3.  Posterior heel spur left   Plan of Care:  1. Patient was evaluated.  2. Injection of 0.5 mL Celestone Soluspan injected into the retrocalcaneal bursa lateral aspect. Care was taken to avoid direct injection into the Achilles tendon. 3.  Continue OTC Motrin 800 mg as needed. 4.  Patient allergic to prednisone; hives. 5.  Recommend good shoe gear that does not irritate the posterior heel and shoes with a slight heel lift 6.  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 Has her 44yr old granddaughter. Cousins w/ patient Lars Masson   Felecia Shelling, DPM [facility]  Dr. Felecia Shelling, DPM    [address] [place],  [place] [address]                Office [phone]  Fax [phone]

## 2021-07-24 ENCOUNTER — Ambulatory Visit: Payer: 59 | Admitting: Nurse Practitioner

## 2021-07-24 ENCOUNTER — Encounter: Payer: Self-pay | Admitting: Nurse Practitioner

## 2021-07-24 VITALS — BP 131/82 | HR 79 | Ht 66.0 in | Wt 233.4 lb

## 2021-07-24 DIAGNOSIS — E669 Obesity, unspecified: Secondary | ICD-10-CM

## 2021-07-24 DIAGNOSIS — Z6841 Body Mass Index (BMI) 40.0 and over, adult: Secondary | ICD-10-CM

## 2021-07-24 DIAGNOSIS — Z6837 Body mass index (BMI) 37.0-37.9, adult: Secondary | ICD-10-CM

## 2021-07-24 NOTE — Progress Notes (Signed)
? ?Established Patient Office Visit ? ?Subjective:  ?Patient ID: Sherri Byrd, female    DOB: May 23, 1960  Age: 61 y.o. MRN: [id number] ? ?CC:  ?Chief Complaint  ?Patient presents with  ? Weight Check  ?  Patient would like to discuss weight loss options.   ? ? ? ?HPI ? ?Sherri Byrd presents to discuss about weight loss medication. She has tried phentermine in the past but did not like the side effects. She wants to discuss about other options for weight loss. Her BMI is 37.67. She has no significant medical history.  ? ?HPI  ? ?Past Medical History:  ?Diagnosis Date  ? Kidney stone   ? ? ?History reviewed. No pertinent surgical history. ? ?Family History  ?Problem Relation Age of Onset  ? Breast cancer Maternal Aunt 30  ? Breast cancer Cousin 56  ?     maternal  ? ? ?Social History  ? ?Socioeconomic History  ? Marital status: Married  ?  Spouse name: Not on file  ? Number of children: Not on file  ? Years of education: Not on file  ? Highest education level: Not on file  ?Occupational History  ? Not on file  ?Tobacco Use  ? Smoking status: Never  ? Smokeless tobacco: Never  ?Vaping Use  ? Vaping Use: Never used  ?Substance and Sexual Activity  ? Alcohol use: Not Currently  ? Drug use: Never  ? Sexual activity: Not on file  ?Other Topics Concern  ? Not on file  ?Social History Narrative  ? Not on file  ? ?Social Determinants of Health  ? ?Financial Resource Strain: Not on file  ?Food Insecurity: Not on file  ?Transportation Needs: Not on file  ?Physical Activity: Not on file  ?Stress: Not on file  ?Social Connections: Not on file  ?Intimate Partner Violence: Not on file  ? ? ? ?Outpatient Medications Prior to Visit  ?Medication Sig Dispense Refill  ? ibuprofen (ADVIL) 800 MG tablet Take 1 tablet (800 mg total) by mouth 3 (three) times daily. 90 tablet 1  ? cyanocobalamin (,VITAMIN B-12,) 1000 MCG/ML injection Inject into the muscle. (Patient not taking: Reported on 07/24/2021)    ? furosemide (LASIX) 20 MG tablet  Take 1 tablet (20 mg total) by mouth daily. (Patient not taking: Reported on 07/24/2021) 30 tablet 3  ? Phendimetrazine Tartrate 35 MG TABS Take 1 tablet by mouth 3 (three) times daily. (Patient not taking: Reported on 07/24/2021)    ? betamethasone acetate-betamethasone sodium phosphate (CELESTONE) injection 3 mg     ? ?No facility-administered medications prior to visit.  ? ? ?Allergies  ?Allergen Reactions  ? Prednisone Shortness Of Breath  ?  tachycardia  ? Meloxicam Other (See Comments)  ?  LE edema  ? Penicillin G Hives  ? Sulfa Antibiotics Hives  ? ? ?ROS ?Review of Systems  ?Constitutional:  Negative for activity change and fatigue.  ?HENT:  Negative for congestion, nosebleeds and trouble swallowing.   ?Eyes:  Negative for photophobia and discharge.  ?Respiratory:  Negative for chest tightness and shortness of breath.   ?Cardiovascular:  Negative for chest pain and palpitations.  ?Gastrointestinal:  Negative for abdominal distention and constipation.  ?Genitourinary:  Negative for difficulty urinating and frequency.  ?Musculoskeletal:  Negative for back pain and neck pain.  ?Skin:  Negative for color change and pallor.  ?Neurological:  Negative for dizziness, facial asymmetry and headaches.  ?Psychiatric/Behavioral:  Negative for agitation, behavioral problems and confusion.   ? ?  ?  Objective:  ?  ?Physical Exam ?Constitutional:   ?   Appearance: Normal appearance. She is obese.  ?HENT:  ?   Head: Normocephalic and atraumatic.  ?   Right Ear: Tympanic membrane normal.  ?   Left Ear: Tympanic membrane normal.  ?   Nose: Nose normal. No congestion or rhinorrhea.  ?   Mouth/Throat:  ?   Mouth: Mucous membranes are moist.  ?   Pharynx: Oropharynx is clear.  ?Eyes:  ?   Extraocular Movements: Extraocular movements intact.  ?   Conjunctiva/sclera: Conjunctivae normal.  ?   Pupils: Pupils are equal, round, and reactive to light.  ?Cardiovascular:  ?   Rate and Rhythm: Normal rate and regular rhythm.  ?   Pulses: Normal  pulses.  ?   Heart sounds: Normal heart sounds.  ?Pulmonary:  ?   Effort: Pulmonary effort is normal.  ?   Breath sounds: Normal breath sounds.  ?Abdominal:  ?   General: Bowel sounds are normal.  ?   Palpations: Abdomen is soft.  ?Musculoskeletal:     ?   General: Normal range of motion.  ?   Cervical back: Normal range of motion and neck supple.  ?Skin: ?   General: Skin is warm.  ?   Capillary Refill: Capillary refill takes less than 2 seconds.  ?Neurological:  ?   General: No focal deficit present.  ?   Mental Status: She is alert and oriented to person, place, and time. Mental status is at baseline.  ?Psychiatric:     ?   Mood and Affect: Mood normal.     ?   Behavior: Behavior normal.     ?   Thought Content: Thought content normal.     ?   Judgment: Judgment normal.  ? ? ?BP 131/82   Pulse 79   Ht $R'5\' 6"'KH$  (1.676 m)   Wt 233 lb 6.4 oz (105.9 kg)   BMI 37.67 kg/m?  ?Wt Readings from Last 3 Encounters:  ?07/24/21 233 lb 6.4 oz (105.9 kg)  ?09/30/20 215 lb 11.2 oz (97.8 kg)  ?05/16/20 226 lb 1.6 oz (102.6 kg)  ? ? ? ?Health Maintenance Due  ?Topic Date Due  ? COVID-19 Vaccine (1) Never done  ? HIV Screening  Never done  ? Hepatitis C Screening  Never done  ? TETANUS/TDAP  Never done  ? PAP SMEAR-Modifier  Never done  ? COLONOSCOPY (Pts 45-38yrs Insurance coverage will need to be confirmed)  Never done  ? Zoster Vaccines- Shingrix (1 of 2) Never done  ? ? ?There are no preventive care reminders to display for this patient. ? ?Lab Results  ?Component Value Date  ? TSH 1.950 09/29/2020  ? ?Lab Results  ?Component Value Date  ? WBC 7.4 09/29/2020  ? HGB 13.0 09/29/2020  ? HCT 37.7 09/29/2020  ? MCV 91 09/29/2020  ? PLT 267 09/29/2020  ? ?No results found for: NA, K, CHLORIDE, CO2, GLUCOSE, BUN, CREATININE, BILITOT, ALKPHOS, AST, ALT, PROT, ALBUMIN, CALCIUM, ANIONGAP, EGFR, GFR ?Lab Results  ?Component Value Date  ? CHOL 168 09/29/2020  ? ?Lab Results  ?Component Value Date  ? HDL 64 09/29/2020  ? ?Lab Results   ?Component Value Date  ? Superior 88 09/29/2020  ? ?Lab Results  ?Component Value Date  ? TRIG 86 09/29/2020  ? ?Lab Results  ?Component Value Date  ? CHOLHDL 2.6 09/29/2020  ? ?No results found for: HGBA1C ? ?  ?Assessment & Plan:  ? ?  Problem List Items Addressed This Visit   ? ?  ? Other  ? Class 3 severe obesity due to excess calories without serious comorbidity with body mass index (BMI) of 40.0 to 44.9 in adult [facility])  ?  BMI 37.67 ?Advised pt to lose weight. ?Advised patient to avoid trans fat, fatty and fried food. ?Follow a regular physical activity schedule. ?Went over the risk of chronic diseases with increased weight.   ?Gave patient hand out on wegovy for weight loss. ?Labs ordered. ? ? ?  ?  ? ?Other Visit Diagnoses   ? ? Class 2 obesity without serious comorbidity with body mass index (BMI) of 37.0 to 37.9 in adult, unspecified obesity type    -  Primary  ? Relevant Orders  ? CBC with Differential/Platelet  ? Lipid panel  ? TSH  ? Hemoglobin A1c  ? COMPLETE METABOLIC PANEL WITH GFR  ? ?  ? ? ? ?No orders of the defined types were placed in this encounter. ? ? ? ?Follow-up: No follow-ups on file.  ? ? ?Theresia Lo, NP ?

## 2021-07-26 NOTE — Assessment & Plan Note (Addendum)
BMI 37.67 ?Advised pt to lose weight. ?Advised patient to avoid trans fat, fatty and fried food. ?Follow a regular physical activity schedule. ?Went over the risk of chronic diseases with increased weight.   ?Gave patient hand out on wegovy for weight loss. ?Labs ordered. ? ? ?

## 2021-08-01 LAB — CMP14+EGFR
ALT: 17 IU/L (ref 0–32)
AST: 20 IU/L (ref 0–40)
Albumin/Globulin Ratio: 2.2 (ref 1.2–2.2)
Albumin: 4.4 g/dL (ref 3.8–4.9)
Alkaline Phosphatase: 100 IU/L (ref 44–121)
BUN/Creatinine Ratio: 20 (ref 9–23)
BUN: 14 mg/dL (ref 6–24)
Bilirubin Total: 0.4 mg/dL (ref 0.0–1.2)
CO2: 20 mmol/L (ref 20–29)
Calcium: 9 mg/dL (ref 8.7–10.2)
Chloride: 105 mmol/L (ref 96–106)
Creatinine, Ser: 0.71 mg/dL (ref 0.57–1.00)
Globulin, Total: 2 g/dL (ref 1.5–4.5)
Glucose: 96 mg/dL (ref 70–99)
Potassium: 4 mmol/L (ref 3.5–5.2)
Sodium: 139 mmol/L (ref 134–144)
Total Protein: 6.4 g/dL (ref 6.0–8.5)
eGFR: 98 mL/min/{1.73_m2} (ref >59)

## 2021-08-01 LAB — LIPID PANEL
Chol/HDL Ratio: 2.5 ratio (ref 0.0–4.4)
Cholesterol, Total: 162 mg/dL (ref 100–199)
HDL: 65 mg/dL (ref >39)
LDL Chol Calc (NIH): 80 mg/dL (ref 0–99)
Triglycerides: 95 mg/dL (ref 0–149)
VLDL Cholesterol Cal: 17 mg/dL (ref 5–40)

## 2021-08-01 LAB — TSH: TSH: 2.16 u[IU]/mL (ref 0.450–4.500)

## 2021-08-01 LAB — HEMOGLOBIN A1C
Est. average glucose Bld gHb Est-mCnc: 105 mg/dL
Hgb A1c MFr Bld: 5.3 % (ref 4.8–5.6)

## 2021-08-07 ENCOUNTER — Encounter: Payer: Self-pay | Admitting: Nurse Practitioner

## 2021-08-07 ENCOUNTER — Ambulatory Visit: Payer: 59 | Admitting: Nurse Practitioner

## 2021-08-07 VITALS — BP 124/77 | HR 84 | Ht 66.0 in | Wt 232.9 lb

## 2021-08-07 DIAGNOSIS — E669 Obesity, unspecified: Secondary | ICD-10-CM | POA: Diagnosis not present

## 2021-08-07 DIAGNOSIS — Z6837 Body mass index (BMI) 37.0-37.9, adult: Secondary | ICD-10-CM

## 2021-08-07 MED ORDER — WEGOVY 0.25 MG/0.5ML ~~LOC~~ SOAJ: 0.2500 mg | SUBCUTANEOUS | 1 refills | Status: DC

## 2021-08-07 NOTE — Assessment & Plan Note (Addendum)
Patient labs WNL. BMI 37.59 in the office today. Started her on wegovy 0.25 mg/0.5 ml once a week subcutaneous injection. Encouraged her to reduce the calorie intake and increase the physical activity. Follow up appointment in a month after starting the medication.

## 2021-08-07 NOTE — Progress Notes (Signed)
Established Patient Office Visit  Subjective:  Patient ID: Sherri Byrd, female    DOB: 04/23/1960  Age: 61 y.o. MRN: [id number]  CC:  Chief Complaint  Patient presents with   lab results      HPI  Sherri Byrd presents for lab review and would liked to be started on wegovy for weight loss. Patient had tried phentermine in the past. She has no medical history at present. The labs are WNL. Patient has no personal or family history of medullarly thyroid carcinoma or multiple endocrine neoplasia syndrome type 2.  HPI   Past Medical History:  Diagnosis Date   Kidney stone     History reviewed. No pertinent surgical history.  Family History  Problem Relation Age of Onset   Breast cancer Maternal Aunt 55   Breast cancer Cousin 53       maternal    Social History   Socioeconomic History   Marital status: Married    Spouse name: Not on file   Number of children: Not on file   Years of education: Not on file   Highest education level: Not on file  Occupational History   Not on file  Tobacco Use   Smoking status: Never   Smokeless tobacco: Never  Vaping Use   Vaping Use: Never used  Substance and Sexual Activity   Alcohol use: Not Currently   Drug use: Never   Sexual activity: Not on file  Other Topics Concern   Not on file  Social History Narrative   Not on file   Social Determinants of Health   Financial Resource Strain: Not on file  Food Insecurity: Not on file  Transportation Needs: Not on file  Physical Activity: Not on file  Stress: Not on file  Social Connections: Not on file  Intimate Partner Violence: Not on file     Outpatient Medications Prior to Visit  Medication Sig Dispense Refill   ibuprofen (ADVIL) 800 MG tablet Take 1 tablet (800 mg total) by mouth 3 (three) times daily. 90 tablet 1   No facility-administered medications prior to visit.    Allergies  Allergen Reactions   Prednisone Shortness Of Breath    tachycardia    Meloxicam Other (See Comments)    LE edema   Penicillin G Hives   Sulfa Antibiotics Hives    ROS Review of Systems  Constitutional:  Negative for activity change, appetite change and fatigue.  HENT:  Negative for congestion, ear discharge and sinus pain.   Eyes:  Negative for photophobia and discharge.  Respiratory:  Negative for apnea, shortness of breath and wheezing.   Cardiovascular:  Negative for chest pain and palpitations.  Gastrointestinal:  Negative for abdominal distention, anal bleeding and diarrhea.  Genitourinary:  Negative for difficulty urinating, flank pain and menstrual problem.  Musculoskeletal:  Negative for arthralgias, gait problem and myalgias.  Skin:  Negative for color change and rash.  Neurological:  Negative for dizziness, facial asymmetry and headaches.  Psychiatric/Behavioral:  Negative for agitation, behavioral problems and confusion.      Objective:    Physical Exam Constitutional:      Appearance: Normal appearance. She is obese.  HENT:     Head: Normocephalic and atraumatic.     Right Ear: Tympanic membrane normal.     Left Ear: Tympanic membrane normal.     Nose: Nose normal.     Mouth/Throat:     Mouth: Mucous membranes are moist.     Pharynx:  Oropharynx is clear.  Eyes:     Extraocular Movements: Extraocular movements intact.     Conjunctiva/sclera: Conjunctivae normal.     Pupils: Pupils are equal, round, and reactive to light.  Cardiovascular:     Rate and Rhythm: Normal rate and regular rhythm.     Pulses: Normal pulses.     Heart sounds: Normal heart sounds.  Pulmonary:     Effort: Pulmonary effort is normal.     Breath sounds: Normal breath sounds.  Abdominal:     General: Bowel sounds are normal.     Palpations: Abdomen is soft.  Musculoskeletal:        General: Normal range of motion.  Skin:    General: Skin is warm.     Capillary Refill: Capillary refill takes less than 2 seconds.     Findings: No bruising.   Neurological:     General: No focal deficit present.     Mental Status: She is alert and oriented to person, place, and time. Mental status is at baseline.  Psychiatric:        Mood and Affect: Mood normal.        Behavior: Behavior normal.        Thought Content: Thought content normal.        Judgment: Judgment normal.    BP 124/77   Pulse 84   Ht $R'5\' 6"'RT$  (1.676 m)   Wt 232 lb 14.4 oz (105.6 kg)   BMI 37.59 kg/m  Wt Readings from Last 3 Encounters:  08/07/21 232 lb 14.4 oz (105.6 kg)  07/24/21 233 lb 6.4 oz (105.9 kg)  09/30/20 215 lb 11.2 oz (97.8 kg)     Health Maintenance Due  Topic Date Due   COVID-19 Vaccine (1) Never done   HIV Screening  Never done   Hepatitis C Screening  Never done   TETANUS/TDAP  Never done   PAP SMEAR-Modifier  Never done   COLONOSCOPY (Pts 45-19yrs Insurance coverage will need to be confirmed)  Never done   Zoster Vaccines- Shingrix (1 of 2) Never done    There are no preventive care reminders to display for this patient.  Lab Results  Component Value Date   TSH 2.160 07/31/2021   Lab Results  Component Value Date   WBC 7.4 09/29/2020   HGB 13.0 09/29/2020   HCT 37.7 09/29/2020   MCV 91 09/29/2020   PLT 267 09/29/2020   Lab Results  Component Value Date   NA 139 07/31/2021   K 4.0 07/31/2021   CO2 20 07/31/2021   GLUCOSE 96 07/31/2021   BUN 14 07/31/2021   CREATININE 0.71 07/31/2021   BILITOT 0.4 07/31/2021   ALKPHOS 100 07/31/2021   AST 20 07/31/2021   ALT 17 07/31/2021   PROT 6.4 07/31/2021   ALBUMIN 4.4 07/31/2021   CALCIUM 9.0 07/31/2021   EGFR 98 07/31/2021   Lab Results  Component Value Date   CHOL 162 07/31/2021   Lab Results  Component Value Date   HDL 65 07/31/2021   Lab Results  Component Value Date   LDLCALC 80 07/31/2021   Lab Results  Component Value Date   TRIG 95 07/31/2021   Lab Results  Component Value Date   CHOLHDL 2.5 07/31/2021   Lab Results  Component Value Date   HGBA1C 5.3  07/31/2021      Assessment & Plan:   Problem List Items Addressed This Visit       Other   Class 2  obesity without serious comorbidity with body mass index (BMI) of 37.0 to 37.9 in adult - Primary    Patient labs WNL. BMI 37.59 in the office today. Started her on wegovy 0.25 mg/0.5 ml once a week subcutaneous injection. Encouraged her to reduce the calorie intake and increase the physical activity. Follow up appointment in a month after starting the medication.        Relevant Medications   Semaglutide-Weight Management (WEGOVY) 0.25 MG/0.5ML SOAJ     Meds ordered this encounter  Medications   Semaglutide-Weight Management (WEGOVY) 0.25 MG/0.5ML SOAJ    Sig: Inject 0.25 mg into the skin once a week.    Dispense:  2 mL    Refill:  1     Follow-up: No follow-ups on file.    Theresia Lo, NP

## 2021-09-04 ENCOUNTER — Encounter: Payer: Self-pay | Admitting: Nurse Practitioner

## 2021-09-04 ENCOUNTER — Ambulatory Visit: Payer: 59 | Admitting: Nurse Practitioner

## 2021-09-04 VITALS — BP 128/86 | HR 84 | Ht 66.0 in | Wt 237.8 lb

## 2021-09-04 DIAGNOSIS — E669 Obesity, unspecified: Secondary | ICD-10-CM

## 2021-09-04 NOTE — Progress Notes (Deleted)
Established Patient Office Visit  Subjective:  Patient ID: Sherri Byrd, female    DOB: [birth year]  Age: 61 y.o. MRN: [id number]  CC:  Chief Complaint  Patient presents with   Follow-up     HPI  DARICA GOREN presents for lab review and would liked to be started on wegovy for weight loss. Patient had tried phentermine in the past. She has no medical history at present. The labs are WNL. Patient has no personal or family history of medullarly thyroid carcinoma or multiple endocrine neoplasia syndrome type 2.  HPI   Past Medical History:  Diagnosis Date   Kidney stone     History reviewed. No pertinent surgical history.  Family History  Problem Relation Age of Onset   Breast cancer Maternal Aunt 6   Breast cancer Cousin 62       maternal    Social History   Socioeconomic History   Marital status: Married    Spouse name: Not on file   Number of children: Not on file   Years of education: Not on file   Highest education level: Not on file  Occupational History   Not on file  Tobacco Use   Smoking status: Never   Smokeless tobacco: Never  Vaping Use   Vaping Use: Never used  Substance and Sexual Activity   Alcohol use: Not Currently   Drug use: Never   Sexual activity: Not on file  Other Topics Concern   Not on file  Social History Narrative   Not on file   Social Determinants of Health   Financial Resource Strain: Not on file  Food Insecurity: Not on file  Transportation Needs: Not on file  Physical Activity: Not on file  Stress: Not on file  Social Connections: Not on file  Intimate Partner Violence: Not on file     Outpatient Medications Prior to Visit  Medication Sig Dispense Refill   ibuprofen (ADVIL) 800 MG tablet Take 1 tablet (800 mg total) by mouth 3 (three) times daily. 90 tablet 1   Semaglutide-Weight Management (WEGOVY) 0.25 MG/0.5ML SOAJ Inject 0.25 mg into the skin once a week. 2 mL 1   No facility-administered medications prior  to visit.    Allergies  Allergen Reactions   Prednisone Shortness Of Breath    tachycardia   Meloxicam Other (See Comments)    LE edema   Penicillin G Hives   Sulfa Antibiotics Hives    ROS Review of Systems  Constitutional:  Negative for activity change, appetite change and fatigue.  HENT:  Negative for congestion, ear discharge and sinus pain.   Eyes:  Negative for photophobia and discharge.  Respiratory:  Negative for apnea, shortness of breath and wheezing.   Cardiovascular:  Negative for chest pain and palpitations.  Gastrointestinal:  Negative for abdominal distention, anal bleeding and diarrhea.  Genitourinary:  Negative for difficulty urinating, flank pain and menstrual problem.  Musculoskeletal:  Negative for arthralgias, gait problem and myalgias.  Skin:  Negative for color change and rash.  Neurological:  Negative for dizziness, facial asymmetry and headaches.  Psychiatric/Behavioral:  Negative for agitation, behavioral problems and confusion.       Objective:    Physical Exam Constitutional:      Appearance: Normal appearance. She is obese.  HENT:     Head: Normocephalic and atraumatic.     Right Ear: Tympanic membrane normal.     Left Ear: Tympanic membrane normal.     Nose: Nose normal.  Mouth/Throat:     Mouth: Mucous membranes are moist.     Pharynx: Oropharynx is clear.  Eyes:     Extraocular Movements: Extraocular movements intact.     Conjunctiva/sclera: Conjunctivae normal.     Pupils: Pupils are equal, round, and reactive to light.  Cardiovascular:     Rate and Rhythm: Normal rate and regular rhythm.     Pulses: Normal pulses.     Heart sounds: Normal heart sounds.  Pulmonary:     Effort: Pulmonary effort is normal.     Breath sounds: Normal breath sounds.  Abdominal:     General: Bowel sounds are normal.     Palpations: Abdomen is soft.  Musculoskeletal:        General: Normal range of motion.  Skin:    General: Skin is warm.      Capillary Refill: Capillary refill takes less than 2 seconds.     Findings: No bruising.  Neurological:     General: No focal deficit present.     Mental Status: She is alert and oriented to person, place, and time. Mental status is at baseline.  Psychiatric:        Mood and Affect: Mood normal.        Behavior: Behavior normal.        Thought Content: Thought content normal.        Judgment: Judgment normal.     BP 128/86   Pulse 84   Ht $R'5\' 6"'NV$  (1.676 m)   Wt 237 lb 12.8 oz (107.9 kg)   BMI 38.38 kg/m  Wt Readings from Last 3 Encounters:  09/04/21 237 lb 12.8 oz (107.9 kg)  08/07/21 232 lb 14.4 oz (105.6 kg)  07/24/21 233 lb 6.4 oz (105.9 kg)     Health Maintenance Due  Topic Date Due   COVID-19 Vaccine (1) Never done   HIV Screening  Never done   Hepatitis C Screening  Never done   TETANUS/TDAP  Never done   PAP SMEAR-Modifier  Never done   COLONOSCOPY (Pts 45-35yrs Insurance coverage will need to be confirmed)  Never done   Zoster Vaccines- Shingrix (1 of 2) Never done    There are no preventive care reminders to display for this patient.  Lab Results  Component Value Date   TSH 2.160 07/31/2021   Lab Results  Component Value Date   WBC 7.4 09/29/2020   HGB 13.0 09/29/2020   HCT 37.7 09/29/2020   MCV 91 09/29/2020   PLT 267 09/29/2020   Lab Results  Component Value Date   NA 139 07/31/2021   K 4.0 07/31/2021   CO2 20 07/31/2021   GLUCOSE 96 07/31/2021   BUN 14 07/31/2021   CREATININE 0.71 07/31/2021   BILITOT 0.4 07/31/2021   ALKPHOS 100 07/31/2021   AST 20 07/31/2021   ALT 17 07/31/2021   PROT 6.4 07/31/2021   ALBUMIN 4.4 07/31/2021   CALCIUM 9.0 07/31/2021   EGFR 98 07/31/2021   Lab Results  Component Value Date   CHOL 162 07/31/2021   Lab Results  Component Value Date   HDL 65 07/31/2021   Lab Results  Component Value Date   LDLCALC 80 07/31/2021   Lab Results  Component Value Date   TRIG 95 07/31/2021   Lab Results  Component  Value Date   CHOLHDL 2.5 07/31/2021   Lab Results  Component Value Date   HGBA1C 5.3 07/31/2021      Assessment & Plan:   Problem  List Items Addressed This Visit   None   No orders of the defined types were placed in this encounter.    Follow-up: No follow-ups on file.    Theresia Lo, NP

## 2021-09-10 NOTE — Progress Notes (Signed)
Patient here to pick the sample for wegovy.

## 2021-10-01 ENCOUNTER — Ambulatory Visit: Payer: 59 | Admitting: Nurse Practitioner

## 2021-10-01 ENCOUNTER — Encounter: Payer: Self-pay | Admitting: Nurse Practitioner

## 2021-10-01 VITALS — BP 127/80 | HR 90 | Ht 66.0 in | Wt 237.0 lb

## 2021-10-01 DIAGNOSIS — R350 Frequency of micturition: Secondary | ICD-10-CM

## 2021-10-01 DIAGNOSIS — N10 Acute tubulo-interstitial nephritis: Secondary | ICD-10-CM | POA: Diagnosis not present

## 2021-10-01 LAB — POCT URINALYSIS DIPSTICK
Bilirubin, UA: NEGATIVE
Blood, UA: NEGATIVE
Glucose, UA: NEGATIVE
Ketones, UA: NEGATIVE
Nitrite, UA: NEGATIVE
Protein, UA: POSITIVE — AB
Spec Grav, UA: 1.025 (ref 1.010–1.025)
Urobilinogen, UA: 0.2 E.U./dL
pH, UA: 5.5 (ref 5.0–8.0)

## 2021-10-01 MED ORDER — CIPROFLOXACIN HCL 500 MG PO TABS: 500.0000 mg | ORAL_TABLET | Freq: Two times a day (BID) | ORAL | 0 refills | Status: AC

## 2021-10-01 MED ORDER — ONDANSETRON HCL 4 MG PO TABS: 4.0000 mg | ORAL_TABLET | Freq: Three times a day (TID) | ORAL | 0 refills | Status: DC | PRN

## 2021-10-01 MED ORDER — MECLIZINE HCL 12.5 MG PO TABS: 12.5000 mg | ORAL_TABLET | Freq: Three times a day (TID) | ORAL | 0 refills | Status: DC | PRN

## 2021-10-01 NOTE — Progress Notes (Signed)
Established Patient Office Visit  Subjective:  Patient ID: Sherri Byrd, female    DOB: 08-30-1960  Age: 61 y.o. MRN: [id number]  CC:  Chief Complaint  Patient presents with   Urinary Tract Infection    Patient reports nausea, burning after urination, frequent urination, and lower back pain.  Patient states she runs a low grade fever at night time for the past 2 days.     HPI  Sherri Byrd presents with symptoms of nausea, burning with urination, frequency and back pain from last 2 days. Patient states she had more than 101 temperature last night.  She took ibuprofen at night and no fever at present.  Patient had history of kidney stones.  Urinary Tract Infection  This is a new problem. The current episode started in the past 7 days. The problem occurs every urination. The problem has been unchanged. The quality of the pain is described as aching. The pain is at a severity of 5/10. The pain is mild. The maximum temperature recorded prior to her arrival was 102 - 102.9 F (at night). The fever has been present for 1 - 2 days. She is Sexually active. There is No history of pyelonephritis. Associated symptoms include frequency, nausea and urgency. Pertinent negatives include no discharge, flank pain or hematuria. She has tried NSAIDs for the symptoms. Her past medical history is significant for kidney stones.     Past Medical History:  Diagnosis Date   Kidney stone     History reviewed. No pertinent surgical history.  Family History  Problem Relation Age of Onset   Breast cancer Maternal Aunt 76   Breast cancer Cousin 80       maternal    Social History   Socioeconomic History   Marital status: Married    Spouse name: Not on file   Number of children: Not on file   Years of education: Not on file   Highest education level: Not on file  Occupational History   Not on file  Tobacco Use   Smoking status: Never   Smokeless tobacco: Never  Vaping Use   Vaping Use: Never  used  Substance and Sexual Activity   Alcohol use: Not Currently   Drug use: Never   Sexual activity: Not on file  Other Topics Concern   Not on file  Social History Narrative   Not on file   Social Determinants of Health   Financial Resource Strain: Not on file  Food Insecurity: Not on file  Transportation Needs: Not on file  Physical Activity: Not on file  Stress: Not on file  Social Connections: Not on file  Intimate Partner Violence: Not on file     Outpatient Medications Prior to Visit  Medication Sig Dispense Refill   ibuprofen (ADVIL) 800 MG tablet Take 1 tablet (800 mg total) by mouth 3 (three) times daily. 90 tablet 1   Semaglutide-Weight Management (WEGOVY) 0.25 MG/0.5ML SOAJ Inject 0.25 mg into the skin once a week. 2 mL 1   No facility-administered medications prior to visit.    Allergies  Allergen Reactions   Prednisone Shortness Of Breath    tachycardia   Meloxicam Other (See Comments)    LE edema   Penicillin G Hives   Sulfa Antibiotics Hives    ROS Review of Systems  Constitutional:  Negative for activity change, appetite change and fatigue.  HENT:  Negative for congestion, ear discharge and sinus pain.   Eyes:  Negative for photophobia  and discharge.  Respiratory:  Negative for apnea, shortness of breath and wheezing.   Cardiovascular:  Negative for chest pain and palpitations.  Gastrointestinal:  Positive for nausea. Negative for abdominal distention, anal bleeding and diarrhea.  Genitourinary:  Positive for frequency and urgency. Negative for difficulty urinating, flank pain, hematuria and menstrual problem.  Musculoskeletal:  Negative for arthralgias, gait problem and myalgias.  Skin:  Negative for color change and rash.  Neurological:  Negative for dizziness, facial asymmetry and headaches.  Psychiatric/Behavioral:  Negative for agitation, behavioral problems and confusion.       Objective:    Physical Exam Constitutional:       Appearance: Normal appearance. She is obese.  HENT:     Head: Normocephalic and atraumatic.     Right Ear: Tympanic membrane normal.     Left Ear: Tympanic membrane normal.     Nose: Nose normal.     Mouth/Throat:     Mouth: Mucous membranes are moist.     Pharynx: Oropharynx is clear.  Eyes:     Extraocular Movements: Extraocular movements intact.     Conjunctiva/sclera: Conjunctivae normal.     Pupils: Pupils are equal, round, and reactive to light.  Cardiovascular:     Rate and Rhythm: Normal rate and regular rhythm.     Pulses: Normal pulses.     Heart sounds: Normal heart sounds.  Pulmonary:     Effort: Pulmonary effort is normal.     Breath sounds: Normal breath sounds.  Abdominal:     General: Bowel sounds are normal.     Palpations: Abdomen is soft. There is no mass.     Tenderness: There is no abdominal tenderness. There is left CVA tenderness.     Hernia: No hernia is present.  Musculoskeletal:        General: Normal range of motion.  Skin:    General: Skin is warm.     Capillary Refill: Capillary refill takes less than 2 seconds.     Findings: No bruising.  Neurological:     General: No focal deficit present.     Mental Status: She is alert and oriented to person, place, and time. Mental status is at baseline.  Psychiatric:        Mood and Affect: Mood normal.        Behavior: Behavior normal.        Thought Content: Thought content normal.        Judgment: Judgment normal.    BP 127/80   Pulse 90   Ht _0  (1.676 m)   Wt 237 lb (107.5 kg)   BMI 38.25 kg/m  Wt Readings from Last 3 Encounters:  10/01/21 237 lb (107.5 kg)  09/04/21 237 lb 12.8 oz (107.9 kg)  08/07/21 232 lb 14.4 oz (105.6 kg)     Health Maintenance Due  Topic Date Due   COVID-19 Vaccine (1) Never done   HIV Screening  Never done   Hepatitis C Screening  Never done   TETANUS/TDAP  Never done   PAP SMEAR-Modifier  Never done   COLONOSCOPY (Pts 45-23yr Insurance coverage will need  to be confirmed)  Never done   Zoster Vaccines- Shingrix (1 of 2) Never done    There are no preventive care reminders to display for this patient.  Lab Results  Component Value Date   TSH 2.160 07/31/2021   Lab Results  Component Value Date   WBC 7.4 09/29/2020   HGB 13.0 09/29/2020  HCT 37.7 09/29/2020   MCV 91 09/29/2020   PLT 267 09/29/2020   Lab Results  Component Value Date   NA 139 07/31/2021   K 4.0 07/31/2021   CO2 20 07/31/2021   GLUCOSE 96 07/31/2021   BUN 14 07/31/2021   CREATININE 0.71 07/31/2021   BILITOT 0.4 07/31/2021   ALKPHOS 100 07/31/2021   AST 20 07/31/2021   ALT 17 07/31/2021   PROT 6.4 07/31/2021   ALBUMIN 4.4 07/31/2021   CALCIUM 9.0 07/31/2021   EGFR 98 07/31/2021   Lab Results  Component Value Date   CHOL 162 07/31/2021   Lab Results  Component Value Date   HDL 65 07/31/2021   Lab Results  Component Value Date   LDLCALC 80 07/31/2021   Lab Results  Component Value Date   TRIG 95 07/31/2021   Lab Results  Component Value Date   CHOLHDL 2.5 07/31/2021   Lab Results  Component Value Date   HGBA1C 5.3 07/31/2021      Assessment & Plan:   Problem List Items Addressed This Visit       Genitourinary   Acute pyelonephritis - Primary    Started patient on ciprofloxacin 500 mg twice a day,  Zofran 4 mg every 8 hours as needed and over-the-counter ibuprofen for pain and fever. Urinalysis positive for leukocytes and protein. Urine sent for culture. Ultrasound ordered.       Relevant Orders   US Renal   Other Visit Diagnoses     Frequency of urination       Relevant Orders   POCT urinalysis dipstick (Completed)   Urine Culture        Meds ordered this encounter  Medications   ciprofloxacin (CIPRO) 500 MG tablet    Sig: Take 1 tablet (500 mg total) by mouth 2 (two) times daily for 10 days.    Dispense:  20 tablet    Refill:  0   DISCONTD: meclizine (ANTIVERT) 12.5 MG tablet    Sig: Take 1 tablet (12.5 mg  total) by mouth 3 (three) times daily as needed for dizziness.    Dispense:  30 tablet    Refill:  0   ondansetron (ZOFRAN) 4 MG tablet    Sig: Take 1 tablet (4 mg total) by mouth every 8 (eight) hours as needed for nausea or vomiting.    Dispense:  20 tablet    Refill:  0     Follow-up: No follow-ups on file.    Theresia Lo, NP

## 2021-10-01 NOTE — Assessment & Plan Note (Addendum)
Started patient on ciprofloxacin 500 mg twice a day,  Zofran 4 mg every 8 hours as needed and over-the-counter ibuprofen for pain and fever. Urinalysis positive for leukocytes and protein. Urine sent for culture. Ultrasound ordered.

## 2021-10-02 ENCOUNTER — Ambulatory Visit
Admission: RE | Admit: 2021-10-02 | Discharge: 2021-10-02 | Disposition: A | Discharge status: Home / Self Care | Payer: 59 | Source: Ambulatory Visit | Attending: Nurse Practitioner | Admitting: Nurse Practitioner

## 2021-10-02 DIAGNOSIS — N10 Acute tubulo-interstitial nephritis: Secondary | ICD-10-CM | POA: Insufficient documentation

## 2021-10-02 LAB — URINE CULTURE
MICRO NUMBER:: 13643086
Result:: NO GROWTH
SPECIMEN QUALITY:: ADEQUATE

## 2021-10-27 ENCOUNTER — Other Ambulatory Visit: Payer: Self-pay | Admitting: Obstetrics and Gynecology

## 2021-10-27 DIAGNOSIS — Z1231 Encounter for screening mammogram for malignant neoplasm of breast: Secondary | ICD-10-CM

## 2021-10-28 ENCOUNTER — Encounter (INDEPENDENT_AMBULATORY_CARE_PROVIDER_SITE_OTHER): Payer: Self-pay

## 2021-11-06 ENCOUNTER — Ambulatory Visit: Payer: 59 | Admitting: Nurse Practitioner

## 2021-11-12 ENCOUNTER — Ambulatory Visit: Payer: 59 | Admitting: Nurse Practitioner

## 2021-11-13 ENCOUNTER — Encounter: Payer: Self-pay | Admitting: Nurse Practitioner

## 2021-11-13 ENCOUNTER — Ambulatory Visit (INDEPENDENT_AMBULATORY_CARE_PROVIDER_SITE_OTHER): Payer: 59 | Admitting: Nurse Practitioner

## 2021-11-13 VITALS — BP 126/74 | HR 98 | Ht 66.0 in | Wt 235.7 lb

## 2021-11-13 DIAGNOSIS — R300 Dysuria: Secondary | ICD-10-CM | POA: Diagnosis not present

## 2021-11-13 DIAGNOSIS — Z0000 Encounter for general adult medical examination without abnormal findings: Secondary | ICD-10-CM | POA: Diagnosis not present

## 2021-11-13 LAB — POCT URINALYSIS DIPSTICK
Bilirubin, UA: NEGATIVE
Glucose, UA: NEGATIVE
Ketones, UA: NEGATIVE
Leukocytes, UA: NEGATIVE
Nitrite, UA: NEGATIVE
Protein, UA: POSITIVE — AB
Spec Grav, UA: >=1.03 — AB (ref 1.010–1.025)
Urobilinogen, UA: 0.2 E.U./dL
pH, UA: 8 (ref 5.0–8.0)

## 2021-11-13 NOTE — Progress Notes (Signed)
Established Patient Office Visit  Subjective:  Patient ID: Sherri Byrd, female    DOB: [birth year]  Age: 61 y.o. MRN: [id number]  CC:  Chief Complaint  Patient presents with   Annual Exam     HPI  Sherri Byrd presents  to the clinic for her annual physical exam. She works at [employer] and have paper work to be filled.   Flu: 2022 Tetanus:unknow COVID:n/a Pap smear: 2022 Dentist:as needed Eye examination: 2022 Exercise: walks for 30 min every other day.  Diet: Patient does eat meat. Patient consumes fruits and veggies. Patient eat some fried food. Patient drinks water and coffee.      HPI   Past Medical History:  Diagnosis Date   Kidney stone     History reviewed. No pertinent surgical history.  Family History  Problem Relation Age of Onset   Breast cancer Maternal Aunt 50   Breast cancer Cousin 69       maternal    Social History   Socioeconomic History   Marital status: Married    Spouse name: Not on file   Number of children: Not on file   Years of education: Not on file   Highest education level: Not on file  Occupational History   Not on file  Tobacco Use   Smoking status: Never   Smokeless tobacco: Never  Vaping Use   Vaping Use: Never used  Substance and Sexual Activity   Alcohol use: Not Currently   Drug use: Never   Sexual activity: Not on file  Other Topics Concern   Not on file  Social History Narrative   Not on file   Social Determinants of Health   Financial Resource Strain: Not on file  Food Insecurity: Not on file  Transportation Needs: Not on file  Physical Activity: Not on file  Stress: Not on file  Social Connections: Not on file  Intimate Partner Violence: Not on file     Outpatient Medications Prior to Visit  Medication Sig Dispense Refill   ibuprofen (ADVIL) 800 MG tablet Take 1 tablet (800 mg total) by mouth 3 (three) times daily. 90 tablet 1   ondansetron (ZOFRAN) 4 MG tablet Take 1 tablet (4 mg total) by  mouth every 8 (eight) hours as needed for nausea or vomiting. 20 tablet 0   Semaglutide-Weight Management (WEGOVY) 0.25 MG/0.5ML SOAJ Inject 0.25 mg into the skin once a week. 2 mL 1   No facility-administered medications prior to visit.    Allergies  Allergen Reactions   Prednisone Shortness Of Breath    tachycardia   Meloxicam Other (See Comments)    LE edema   Penicillin G Hives   Sulfa Antibiotics Hives    ROS Review of Systems  Constitutional: Negative.   HENT: Negative.    Eyes: Negative.   Respiratory:  Negative for apnea and cough.   Cardiovascular: Negative.   Gastrointestinal: Negative.   Genitourinary:  Positive for dysuria.  Musculoskeletal: Negative.   Skin: Negative.   Neurological: Negative.   Psychiatric/Behavioral:  Negative for agitation, behavioral problems and confusion.       Objective:    Physical Exam Constitutional:      Appearance: Normal appearance. She is obese.  HENT:     Head: Normocephalic.     Right Ear: Tympanic membrane normal.     Left Ear: Tympanic membrane normal.     Nose: Nose normal.     Mouth/Throat:     Mouth: Mucous membranes  are moist.     Pharynx: Oropharynx is clear.  Eyes:     Extraocular Movements: Extraocular movements intact.     Conjunctiva/sclera: Conjunctivae normal.     Pupils: Pupils are equal, round, and reactive to light.  Cardiovascular:     Rate and Rhythm: Normal rate and regular rhythm.     Pulses: Normal pulses.     Heart sounds: Normal heart sounds.  Pulmonary:     Effort: Pulmonary effort is normal. No respiratory distress.     Breath sounds: Normal breath sounds. No rhonchi.  Abdominal:     General: Bowel sounds are normal.     Palpations: Abdomen is soft. There is no mass.     Tenderness: There is no abdominal tenderness.     Hernia: No hernia is present.  Musculoskeletal:        General: Normal range of motion.     Cervical back: Neck supple. No tenderness.  Skin:    General: Skin is  warm.     Capillary Refill: Capillary refill takes less than 2 seconds.  Neurological:     General: No focal deficit present.     Mental Status: She is alert and oriented to person, place, and time. Mental status is at baseline.  Psychiatric:        Mood and Affect: Mood normal.        Behavior: Behavior normal.        Thought Content: Thought content normal.        Judgment: Judgment normal.     BP 126/74   Pulse 98   Ht _0  (1.676 m)   Wt 235 lb 11.2 oz (106.9 kg)   BMI 38.04 kg/m  Wt Readings from Last 3 Encounters:  11/13/21 235 lb 11.2 oz (106.9 kg)  10/01/21 237 lb (107.5 kg)  09/04/21 237 lb 12.8 oz (107.9 kg)     Health Maintenance  Topic Date Due   INFLUENZA VACCINE  10/20/2021   COVID-19 Vaccine (1) 11/29/2021 (Originally 09/13/1961)   Zoster Vaccines- Shingrix (1 of 2) 02/13/2022 (Originally 03/14/2012)   TETANUS/TDAP  11/14/2022 (Originally 03/14/1981)   MAMMOGRAM  10/03/2022   PAP SMEAR-Modifier  09/20/2023   COLONOSCOPY (Pts 45-18yr Insurance coverage will need to be confirmed)  03/22/2028   HPV VACCINES  Aged Out   Hepatitis C Screening  Discontinued   HIV Screening  Discontinued    There are no preventive care reminders to display for this patient.  Lab Results  Component Value Date   TSH 2.160 07/31/2021   Lab Results  Component Value Date   WBC 7.4 09/29/2020   HGB 13.0 09/29/2020   HCT 37.7 09/29/2020   MCV 91 09/29/2020   PLT 267 09/29/2020   Lab Results  Component Value Date   NA 139 07/31/2021   K 4.0 07/31/2021   CO2 20 07/31/2021   GLUCOSE 96 07/31/2021   BUN 14 07/31/2021   CREATININE 0.71 07/31/2021   BILITOT 0.4 07/31/2021   ALKPHOS 100 07/31/2021   AST 20 07/31/2021   ALT 17 07/31/2021   PROT 6.4 07/31/2021   ALBUMIN 4.4 07/31/2021   CALCIUM 9.0 07/31/2021   EGFR 98 07/31/2021   Lab Results  Component Value Date   CHOL 162 07/31/2021   Lab Results  Component Value Date   HDL 65 07/31/2021   Lab Results   Component Value Date   LDLCALC 80 07/31/2021   Lab Results  Component Value Date   TRIG 95 07/31/2021  Lab Results  Component Value Date   CHOLHDL 2.5 07/31/2021   Lab Results  Component Value Date   HGBA1C 5.3 07/31/2021      Assessment & Plan:   Problem List Items Addressed This Visit       Other   Annual physical exam - Primary    Encouraged patient to consume a balanced diet and regular exercise regimen. Advised to see an eye doctor and dentist annually.  Paperwork completed for work.      Dysuria    Urinalysis shows blood and protein in the urine. Advised patient to see a urologist but patient refused at present.       Relevant Orders   POCT urinalysis dipstick (Completed)     No orders of the defined types were placed in this encounter.    Follow-up: No follow-ups on file.    Theresia Lo, NP

## 2021-11-13 NOTE — Assessment & Plan Note (Addendum)
Encouraged patient to consume a balanced diet and regular exercise regimen. Advised to see an eye doctor and dentist annually.  Paperwork completed for work.

## 2021-11-13 NOTE — Assessment & Plan Note (Signed)
Urinalysis shows blood and protein in the urine. Advised patient to see a urologist but patient refused at present.

## 2021-12-11 ENCOUNTER — Ambulatory Visit: Payer: 59 | Admitting: Nurse Practitioner

## 2021-12-11 ENCOUNTER — Encounter: Payer: Self-pay | Admitting: Nurse Practitioner

## 2021-12-11 VITALS — BP 136/73 | HR 99 | Ht 66.0 in | Wt 237.1 lb

## 2021-12-11 DIAGNOSIS — R600 Localized edema: Secondary | ICD-10-CM

## 2021-12-11 MED ORDER — SPIRONOLACTONE 25 MG PO TABS: 12.5000 mg | ORAL_TABLET | Freq: Every day | ORAL | 1 refills | Status: DC

## 2021-12-11 NOTE — Progress Notes (Deleted)
Established Patient Office Visit  Subjective:  Patient ID: Sherri Byrd, female    DOB: 1961-03-03  Age: 61 y.o. MRN: [id number]  CC: No chief complaint on file.    HPI  [name] presents for:  HPI   Past Medical History:  Diagnosis Date   Kidney stone     No past surgical history on file.  Family History  Problem Relation Age of Onset   Breast cancer Maternal Aunt 50   Breast cancer Cousin 92       maternal    Social History   Socioeconomic History   Marital status: Married    Spouse name: Not on file   Number of children: Not on file   Years of education: Not on file   Highest education level: Not on file  Occupational History   Not on file  Tobacco Use   Smoking status: Never   Smokeless tobacco: Never  Vaping Use   Vaping Use: Never used  Substance and Sexual Activity   Alcohol use: Not Currently   Drug use: Never   Sexual activity: Not on file  Other Topics Concern   Not on file  Social History Narrative   Not on file   Social Determinants of Health   Financial Resource Strain: Not on file  Food Insecurity: Not on file  Transportation Needs: Not on file  Physical Activity: Not on file  Stress: Not on file  Social Connections: Not on file  Intimate Partner Violence: Not on file     Outpatient Medications Prior to Visit  Medication Sig Dispense Refill   ibuprofen (ADVIL) 800 MG tablet Take 1 tablet (800 mg total) by mouth 3 (three) times daily. 90 tablet 1   ondansetron (ZOFRAN) 4 MG tablet Take 1 tablet (4 mg total) by mouth every 8 (eight) hours as needed for nausea or vomiting. 20 tablet 0   Semaglutide-Weight Management (WEGOVY) 0.25 MG/0.5ML SOAJ Inject 0.25 mg into the skin once a week. 2 mL 1   No facility-administered medications prior to visit.    Allergies  Allergen Reactions   Prednisone Shortness Of Breath    tachycardia   Meloxicam Other (See Comments)    LE edema   Penicillin G Hives   Sulfa Antibiotics Hives     ROS Review of Systems    Objective:    Physical Exam  There were no vitals taken for this visit. Wt Readings from Last 3 Encounters:  11/13/21 235 lb 11.2 oz (106.9 kg)  10/01/21 237 lb (107.5 kg)  09/04/21 237 lb 12.8 oz (107.9 kg)     Health Maintenance  Topic Date Due   COVID-19 Vaccine (1) Never done   INFLUENZA VACCINE  10/20/2021   Zoster Vaccines- Shingrix (1 of 2) 02/13/2022 (Originally 03/14/2012)   TETANUS/TDAP  11/14/2022 (Originally 03/14/1981)   MAMMOGRAM  10/03/2022   PAP SMEAR-Modifier  09/20/2023   COLONOSCOPY (Pts 45-91yrs Insurance coverage will need to be confirmed)  03/22/2028   HPV VACCINES  Aged Out   Hepatitis C Screening  Discontinued   HIV Screening  Discontinued    There are no preventive care reminders to display for this patient.  Lab Results  Component Value Date   TSH 2.160 07/31/2021   Lab Results  Component Value Date   WBC 7.4 09/29/2020   HGB 13.0 09/29/2020   HCT 37.7 09/29/2020   MCV 91 09/29/2020   PLT 267 09/29/2020   Lab Results  Component Value Date  NA 139 07/31/2021   K 4.0 07/31/2021   CO2 20 07/31/2021   GLUCOSE 96 07/31/2021   BUN 14 07/31/2021   CREATININE 0.71 07/31/2021   BILITOT 0.4 07/31/2021   ALKPHOS 100 07/31/2021   AST 20 07/31/2021   ALT 17 07/31/2021   PROT 6.4 07/31/2021   ALBUMIN 4.4 07/31/2021   CALCIUM 9.0 07/31/2021   EGFR 98 07/31/2021   Lab Results  Component Value Date   CHOL 162 07/31/2021   Lab Results  Component Value Date   HDL 65 07/31/2021   Lab Results  Component Value Date   LDLCALC 80 07/31/2021   Lab Results  Component Value Date   TRIG 95 07/31/2021   Lab Results  Component Value Date   CHOLHDL 2.5 07/31/2021   Lab Results  Component Value Date   HGBA1C 5.3 07/31/2021      Assessment & Plan:   Problem List Items Addressed This Visit   None    No orders of the defined types were placed in this encounter.    Follow-up: No follow-ups on file.     Theresia Lo, NP

## 2021-12-11 NOTE — Progress Notes (Unsigned)
Established Patient Office Visit  Subjective:  Patient ID: Sherri Byrd, female    DOB: March 30, 1960  Age: 61 y.o. MRN: [id number]  CC:  Chief Complaint  Patient presents with   Leg Pain    Patient has complaint of bilateral leg pain that started around 2 weeks ago. She states that she has not changed anything wit her activity level and has not added any new medications. Patient states that leg pain is consistent regardless of walking, standing, or sitting.      HPI  Sherri Byrd presents with symptoms of nausea, burning with urination, frequency and back pain from last 2 days. Patient states she had more than 101 temperature last night.  She took ibuprofen at night and no fever at present.  Patient had history of kidney stones.  HPI   Past Medical History:  Diagnosis Date   Kidney stone     History reviewed. No pertinent surgical history.  Family History  Problem Relation Age of Onset   Breast cancer Maternal Aunt 85   Breast cancer Cousin 87       maternal    Social History   Socioeconomic History   Marital status: Married    Spouse name: Not on file   Number of children: Not on file   Years of education: Not on file   Highest education level: Not on file  Occupational History   Not on file  Tobacco Use   Smoking status: Never   Smokeless tobacco: Never  Vaping Use   Vaping Use: Never used  Substance and Sexual Activity   Alcohol use: Not Currently   Drug use: Never   Sexual activity: Not on file  Other Topics Concern   Not on file  Social History Narrative   Not on file   Social Determinants of Health   Financial Resource Strain: Not on file  Food Insecurity: Not on file  Transportation Needs: Not on file  Physical Activity: Not on file  Stress: Not on file  Social Connections: Not on file  Intimate Partner Violence: Not on file     Outpatient Medications Prior to Visit  Medication Sig Dispense Refill   ibuprofen (ADVIL) 800 MG tablet Take  1 tablet (800 mg total) by mouth 3 (three) times daily. 90 tablet 1   ondansetron (ZOFRAN) 4 MG tablet Take 1 tablet (4 mg total) by mouth every 8 (eight) hours as needed for nausea or vomiting. 20 tablet 0   Semaglutide-Weight Management (WEGOVY) 0.25 MG/0.5ML SOAJ Inject 0.25 mg into the skin once a week. 2 mL 1   No facility-administered medications prior to visit.    Allergies  Allergen Reactions   Prednisone Shortness Of Breath    tachycardia   Meloxicam Other (See Comments)    LE edema   Penicillin G Hives   Sulfa Antibiotics Hives    ROS Review of Systems  Constitutional:  Negative for activity change, appetite change and fatigue.  HENT:  Negative for congestion, ear discharge and sinus pain.   Eyes:  Negative for photophobia and discharge.  Respiratory:  Negative for apnea, shortness of breath and wheezing.   Cardiovascular:  Positive for leg swelling. Negative for chest pain and palpitations.  Gastrointestinal:  Negative for abdominal distention, anal bleeding and diarrhea.  Genitourinary:  Negative for difficulty urinating and menstrual problem.  Musculoskeletal:  Negative for arthralgias, gait problem and myalgias.  Skin:  Negative for color change and rash.  Neurological:  Negative for dizziness,  facial asymmetry and headaches.  Psychiatric/Behavioral:  Negative for agitation, behavioral problems and confusion.       Objective:    Physical Exam Constitutional:      Appearance: Normal appearance. She is obese.  HENT:     Head: Normocephalic and atraumatic.     Right Ear: Tympanic membrane normal.     Left Ear: Tympanic membrane normal.     Nose: Nose normal.     Mouth/Throat:     Mouth: Mucous membranes are moist.     Pharynx: Oropharynx is clear.  Eyes:     Extraocular Movements: Extraocular movements intact.     Conjunctiva/sclera: Conjunctivae normal.     Pupils: Pupils are equal, round, and reactive to light.  Cardiovascular:     Rate and Rhythm:  Normal rate and regular rhythm.     Pulses: Normal pulses.     Heart sounds: Normal heart sounds.  Pulmonary:     Effort: Pulmonary effort is normal.     Breath sounds: Normal breath sounds.  Abdominal:     General: Bowel sounds are normal.     Palpations: Abdomen is soft. There is no mass.     Tenderness: There is no abdominal tenderness.     Hernia: No hernia is present.  Musculoskeletal:        General: Normal range of motion.     Right lower leg: Edema (over all) present.     Left lower leg: Edema (over all) present.  Skin:    General: Skin is warm.     Capillary Refill: Capillary refill takes less than 2 seconds.     Findings: No bruising.  Neurological:     General: No focal deficit present.     Mental Status: She is alert and oriented to person, place, and time. Mental status is at baseline.  Psychiatric:        Mood and Affect: Mood normal.        Behavior: Behavior normal.        Thought Content: Thought content normal.        Judgment: Judgment normal.     BP 136/73   Pulse 99   Ht $R'5\' 6"'PI$  (1.676 m)   Wt 237 lb 1.6 oz (107.5 kg)   BMI 38.27 kg/m  Wt Readings from Last 3 Encounters:  12/11/21 237 lb 1.6 oz (107.5 kg)  11/13/21 235 lb 11.2 oz (106.9 kg)  10/01/21 237 lb (107.5 kg)     Health Maintenance Due  Topic Date Due   COVID-19 Vaccine (1) Never done   INFLUENZA VACCINE  10/20/2021    There are no preventive care reminders to display for this patient.  Lab Results  Component Value Date   TSH 2.160 07/31/2021   Lab Results  Component Value Date   WBC 7.4 09/29/2020   HGB 13.0 09/29/2020   HCT 37.7 09/29/2020   MCV 91 09/29/2020   PLT 267 09/29/2020   Lab Results  Component Value Date   NA 139 07/31/2021   K 4.0 07/31/2021   CO2 20 07/31/2021   GLUCOSE 96 07/31/2021   BUN 14 07/31/2021   CREATININE 0.71 07/31/2021   BILITOT 0.4 07/31/2021   ALKPHOS 100 07/31/2021   AST 20 07/31/2021   ALT 17 07/31/2021   PROT 6.4 07/31/2021    ALBUMIN 4.4 07/31/2021   CALCIUM 9.0 07/31/2021   EGFR 98 07/31/2021   Lab Results  Component Value Date   CHOL 162 07/31/2021   Lab  Results  Component Value Date   HDL 65 07/31/2021   Lab Results  Component Value Date   LDLCALC 80 07/31/2021   Lab Results  Component Value Date   TRIG 95 07/31/2021   Lab Results  Component Value Date   CHOLHDL 2.5 07/31/2021   Lab Results  Component Value Date   HGBA1C 5.3 07/31/2021      Assessment & Plan:   Problem List Items Addressed This Visit   None   No orders of the defined types were placed in this encounter.    Follow-up: No follow-ups on file.    Theresia Lo, NP

## 2021-12-12 DIAGNOSIS — R600 Localized edema: Secondary | ICD-10-CM | POA: Insufficient documentation

## 2021-12-12 NOTE — Assessment & Plan Note (Signed)
Started her on spironolactone 25 mg daily. Advised patient to keep the feet elevated and wear compression stockings. We will continue to monitor.

## 2022-01-17 ENCOUNTER — Ambulatory Visit: Admit: 2022-01-17 | Discharge status: Against Medical Advice | Payer: 59

## 2022-01-18 ENCOUNTER — Ambulatory Visit: Payer: Self-pay

## 2022-01-22 ENCOUNTER — Ambulatory Visit: Payer: 59

## 2022-02-19 ENCOUNTER — Ambulatory Visit: Payer: 59 | Admitting: Nurse Practitioner

## 2022-02-26 ENCOUNTER — Ambulatory Visit (INDEPENDENT_AMBULATORY_CARE_PROVIDER_SITE_OTHER): Payer: 59 | Admitting: Nurse Practitioner

## 2022-02-26 ENCOUNTER — Encounter: Payer: Self-pay | Admitting: Nurse Practitioner

## 2022-02-26 VITALS — BP 126/74 | HR 94 | Ht 66.0 in | Wt 237.5 lb

## 2022-02-26 DIAGNOSIS — E669 Obesity, unspecified: Secondary | ICD-10-CM

## 2022-02-26 MED ORDER — IBUPROFEN 800 MG PO TABS: 800.0000 mg | ORAL_TABLET | Freq: Three times a day (TID) | ORAL | 1 refills | Status: DC

## 2022-02-26 MED ORDER — OZEMPIC (0.25 OR 0.5 MG/DOSE) 2 MG/3ML ~~LOC~~ SOPN: 0.2500 mg | PEN_INJECTOR | SUBCUTANEOUS | 4 refills | Status: DC

## 2022-02-26 NOTE — Assessment & Plan Note (Addendum)
Her BMI 38.33. Will switch her from [facility] to Ozempic. Encouraged her to follow healthy diet and regular exercise routine.

## 2022-02-26 NOTE — Progress Notes (Signed)
Established Patient Office Visit  Subjective:  Patient ID: Sherri Byrd, female    DOB: 06-Aug-1960  Age: 61 y.o. MRN: [id number]  CC:  Chief Complaint  Patient presents with   Medication Management    Pt notes wegovy not available would like to switch      HPI  Sherri Byrd presents for follow up on weight loss medication. She was taking wegovy for weight management but wegovy is on back order. She would like to try ozempic for weight loss.   HPI   Past Medical History:  Diagnosis Date   Kidney stone     History reviewed. No pertinent surgical history.  Family History  Problem Relation Age of Onset   Breast cancer Maternal Aunt 78   Breast cancer Cousin 64       maternal    Social History   Socioeconomic History   Marital status: Married    Spouse name: Not on file   Number of children: Not on file   Years of education: Not on file   Highest education level: Not on file  Occupational History   Not on file  Tobacco Use   Smoking status: Never   Smokeless tobacco: Never  Vaping Use   Vaping Use: Never used  Substance and Sexual Activity   Alcohol use: Not Currently   Drug use: Never   Sexual activity: Not on file  Other Topics Concern   Not on file  Social History Narrative   Not on file   Social Determinants of Health   Financial Resource Strain: Not on file  Food Insecurity: Not on file  Transportation Needs: Not on file  Physical Activity: Not on file  Stress: Not on file  Social Connections: Not on file  Intimate Partner Violence: Not on file     Outpatient Medications Prior to Visit  Medication Sig Dispense Refill   spironolactone (ALDACTONE) 25 MG tablet Take 0.5 tablets (12.5 mg total) by mouth daily. 30 tablet 1   ibuprofen (ADVIL) 800 MG tablet Take 1 tablet (800 mg total) by mouth 3 (three) times daily. 90 tablet 1   ondansetron (ZOFRAN) 4 MG tablet Take 1 tablet (4 mg total) by mouth every 8 (eight) hours as needed for nausea or  vomiting. 20 tablet 0   Semaglutide-Weight Management (WEGOVY) 0.25 MG/0.5ML SOAJ Inject 0.25 mg into the skin once a week. 2 mL 1   No facility-administered medications prior to visit.    Allergies  Allergen Reactions   Prednisone Shortness Of Breath    tachycardia   Meloxicam Other (See Comments)    LE edema   Penicillin G Hives   Sulfa Antibiotics Hives    ROS Review of Systems  Constitutional: Negative.   HENT: Negative.    Eyes: Negative.   Respiratory:  Negative for chest tightness and shortness of breath.   Cardiovascular:  Negative for chest pain and leg swelling.  Gastrointestinal: Negative.   Genitourinary: Negative.   Musculoskeletal: Negative.   Neurological:  Negative for dizziness, facial asymmetry and headaches.  Psychiatric/Behavioral:  Negative for agitation, behavioral problems and confusion.       Objective:    Physical Exam Constitutional:      Appearance: Normal appearance. She is obese.  HENT:     Head: Normocephalic.     Right Ear: Tympanic membrane normal.     Left Ear: Tympanic membrane normal.     Nose: Nose normal.     Mouth/Throat:  Mouth: Mucous membranes are moist.     Pharynx: Oropharynx is clear.  Eyes:     Extraocular Movements: Extraocular movements intact.     Conjunctiva/sclera: Conjunctivae normal.     Pupils: Pupils are equal, round, and reactive to light.  Cardiovascular:     Rate and Rhythm: Normal rate and regular rhythm.     Pulses: Normal pulses.     Heart sounds: Normal heart sounds.  Pulmonary:     Effort: Pulmonary effort is normal. No respiratory distress.     Breath sounds: Normal breath sounds. No rhonchi.  Abdominal:     General: Bowel sounds are normal.     Palpations: Abdomen is soft. There is no mass.     Tenderness: There is no abdominal tenderness.     Hernia: No hernia is present.  Musculoskeletal:        General: Normal range of motion.     Cervical back: Neck supple. No tenderness.  Skin:     General: Skin is warm.     Capillary Refill: Capillary refill takes less than 2 seconds.  Neurological:     General: No focal deficit present.     Mental Status: She is alert and oriented to person, place, and time. Mental status is at baseline.  Psychiatric:        Mood and Affect: Mood normal.        Behavior: Behavior normal.        Thought Content: Thought content normal.        Judgment: Judgment normal.     BP 126/74   Pulse 94   Ht _0  (1.676 m)   Wt 237 lb 8 oz (107.7 kg)   SpO2 97%   BMI 38.33 kg/m  Wt Readings from Last 3 Encounters:  02/26/22 237 lb 8 oz (107.7 kg)  12/11/21 237 lb 1.6 oz (107.5 kg)  11/13/21 235 lb 11.2 oz (106.9 kg)     Health Maintenance  Topic Date Due   COVID-19 Vaccine (1) Never done   DTaP/Tdap/Td (1 - Tdap) Never done   Zoster Vaccines- Shingrix (1 of 2) Never done   INFLUENZA VACCINE  10/20/2021   MAMMOGRAM  10/03/2022   PAP SMEAR-Modifier  09/20/2023   COLONOSCOPY (Pts 45-47yr Insurance coverage will need to be confirmed)  03/22/2028   HPV VACCINES  Aged Out   Hepatitis C Screening  Discontinued   HIV Screening  Discontinued    There are no preventive care reminders to display for this patient.  Lab Results  Component Value Date   TSH 2.160 07/31/2021   Lab Results  Component Value Date   WBC 7.4 09/29/2020   HGB 13.0 09/29/2020   HCT 37.7 09/29/2020   MCV 91 09/29/2020   PLT 267 09/29/2020   Lab Results  Component Value Date   NA 139 07/31/2021   K 4.0 07/31/2021   CO2 20 07/31/2021   GLUCOSE 96 07/31/2021   BUN 14 07/31/2021   CREATININE 0.71 07/31/2021   BILITOT 0.4 07/31/2021   ALKPHOS 100 07/31/2021   AST 20 07/31/2021   ALT 17 07/31/2021   PROT 6.4 07/31/2021   ALBUMIN 4.4 07/31/2021   CALCIUM 9.0 07/31/2021   EGFR 98 07/31/2021   Lab Results  Component Value Date   CHOL 162 07/31/2021   Lab Results  Component Value Date   HDL 65 07/31/2021   Lab Results  Component Value Date   LDLCALC 80  07/31/2021   Lab Results  Component Value Date   TRIG 95 07/31/2021   Lab Results  Component Value Date   CHOLHDL 2.5 07/31/2021   Lab Results  Component Value Date   HGBA1C 5.3 07/31/2021      Assessment & Plan:   Problem List Items Addressed This Visit       Other   Obesity (BMI 35.0-39.9 without comorbidity) - Primary    Her BMI 38.33. Will switch her from [facility] to [facility]. Encouraged her to follow healthy diet and regular exercise routine.       Relevant Medications   Semaglutide,0.25 or 0.5MG/DOS, (OZEMPIC, 0.25 OR 0.5 MG/DOSE,) 2 MG/3ML SOPN     Meds ordered this encounter  Medications   ibuprofen (ADVIL) 800 MG tablet    Sig: Take 1 tablet (800 mg total) by mouth 3 (three) times daily.    Dispense:  90 tablet    Refill:  1   Semaglutide,0.25 or 0.5MG/DOS, (OZEMPIC, 0.25 OR 0.5 MG/DOSE,) 2 MG/3ML SOPN    Sig: Inject 0.25 mg into the skin once a week.    Dispense:  3 mL    Refill:  4     Follow-up: No follow-ups on file.    Theresia Lo, NP

## 2022-09-14 ENCOUNTER — Other Ambulatory Visit: Payer: Self-pay | Admitting: Nurse Practitioner

## 2022-09-21 ENCOUNTER — Ambulatory Visit
Admission: RE | Admit: 2022-09-21 | Discharge: 2022-09-21 | Disposition: A | Discharge status: Home / Self Care | Payer: 59 | Source: Ambulatory Visit | Attending: Internal Medicine | Admitting: Internal Medicine

## 2022-09-21 VITALS — BP 130/74 | HR 90 | Temp 97.8°F | Resp 16

## 2022-09-21 DIAGNOSIS — M25561 Pain in right knee: Secondary | ICD-10-CM

## 2022-09-21 NOTE — ED Provider Notes (Signed)
[name]    CSN: [id number] Arrival date & time: 09/21/22  1705      History   Chief Complaint Chief Complaint  Patient presents with   Leg Pain    Entered by patient    HPI Sherri Byrd is a 62 y.o. female.    Leg Pain  To UC with complaint of bilateral knee pain.  She states she has had right knee pain for 2 weeks and now complains of left knee pain.  Has been treating symptoms with Tylenol.  Denies changes in activity.  Denies injury or known precipitating event.  PMH includes osteoarthritis of left knee.  Review of the patient's chart includes x-ray result showing tricompartmental degenerative changes.   Past Medical History:  Diagnosis Date   Kidney stone     Patient Active Problem List   Diagnosis Date Noted   Lower leg edema 12/12/2021   Annual physical exam 11/13/2021   Dysuria 11/13/2021   Acute pyelonephritis 10/01/2021   Pain of left heel 05/16/2020   Hip pain 11/02/2019   Obesity (BMI 35.0-39.9 without comorbidity) 11/02/2019   Edema 11/02/2019   Osteoarthritis of knee 01/28/2017    History reviewed. No pertinent surgical history.  OB History   No obstetric history on file.      Home Medications    Prior to Admission medications   Medication Sig Start Date End Date Taking? Authorizing Provider  ibuprofen (ADVIL) 800 MG tablet Take 1 tablet (800 mg total) by mouth 3 (three) times daily. 02/26/22   Kara Dies, NP  Semaglutide,0.25 or 0.5MG /DOS, (OZEMPIC, 0.25 OR 0.5 MG/DOSE,) 2 MG/3ML SOPN Inject 0.25 mg into the skin once a week. 02/26/22   Kara Dies, NP  spironolactone (ALDACTONE) 25 MG tablet Take 0.5 tablets (12.5 mg total) by mouth daily. 12/11/21   Kara Dies, NP    Family History Family History  Problem Relation Age of Onset   Breast cancer Maternal Aunt 58   Breast cancer Cousin 63       maternal    Social History Social History   Tobacco Use   Smoking status: Never   Smokeless tobacco: Never   Vaping Use   Vaping Use: Never used  Substance Use Topics   Alcohol use: Not Currently   Drug use: Never     Allergies   Prednisone, Meloxicam, Penicillin g, and Sulfa antibiotics   Review of Systems Review of Systems   Physical Exam Triage Vital Signs ED Triage Vitals  Enc Vitals Group     BP      Pulse      Resp      Temp      Temp src      SpO2      Weight      Height      Head Circumference      Peak Flow      Pain Score      Pain Loc      Pain Edu?      Excl. in GC?    No data found.  Updated Vital Signs BP 130/74 (BP Location: Left Arm)   Pulse 90   Temp 97.8 F (36.6 C) (Temporal)   Resp 16   SpO2 98%   Visual Acuity Right Eye Distance:   Left Eye Distance:   Bilateral Distance:    Right Eye Near:   Left Eye Near:    Bilateral Near:     Physical Exam Vitals reviewed.  Constitutional:      Appearance: Normal appearance.  Musculoskeletal:     Right knee: No LCL laxity, MCL laxity, ACL laxity or PCL laxity.     Instability Tests: Anterior drawer test negative. Posterior drawer test negative. Anterior Lachman test negative. Medial McMurray test negative and lateral McMurray test negative.     Comments: Negative stability tests on the right but tenderness while performing.  Skin:    General: Skin is warm and dry.  Neurological:     General: No focal deficit present.     Mental Status: She is alert and oriented to person, place, and time.  Psychiatric:        Mood and Affect: Mood normal.        Behavior: Behavior normal.      UC Treatments / Results  Labs (all labs ordered are listed, but only abnormal results are displayed) Labs Reviewed - No data to display  EKG   Radiology No results found.  Procedures Procedures (including critical care time)  Medications Ordered in UC Medications - No data to display  Initial Impression / Assessment and Plan / UC Course  I have reviewed the triage vital signs and the nursing  notes.  Pertinent labs & imaging results that were available during my care of the patient were reviewed by me and considered in my medical decision making (see chart for details).   Sherri Byrd is a 62 y.o. female presenting with bilateral knee pain, R > L. Patient is afebrile without recent antipyretics, satting well on room air. Overall is well appearing though non-toxic, well hydrated, without respiratory distress.  Reviewed relevant chart history.   Suspect osteoarthritis of the right knee given the patient's history of same on the left.  Recommended continued use of NSAID medication for symptom relief.  Suggested evaluation by orthopedic provider who would likely perform imaging for positive diagnosis and recommended treatment plan.  Patient expressed frustration for payment of co-pay with only OTC treatment plan.  Final Clinical Impressions(s) / UC Diagnoses   Final diagnoses:  None   Discharge Instructions   None    ED Prescriptions   None    PDMP not reviewed this encounter.   Charma Igo, [place] 09/21/22 1734

## 2022-09-21 NOTE — ED Triage Notes (Signed)
Patient presents to [facility] for bilateral knee pain. Right knee pain started 2 weeks ago, now her left knee is aching. Treating pain with tylenol. No changes in activity or injury.

## 2022-09-21 NOTE — Discharge Instructions (Signed)
Recommend evaluation of your knee pain by an orthopedic provider.  I have provide contact information for [facility] which is an orthopedic urgent care.  Alternatively, you may ask your primary care provider for referral.

## 2022-09-22 DIAGNOSIS — M13862 Other specified arthritis, left knee: Secondary | ICD-10-CM | POA: Diagnosis not present

## 2022-09-22 DIAGNOSIS — M13861 Other specified arthritis, right knee: Secondary | ICD-10-CM | POA: Diagnosis not present

## 2022-10-29 DIAGNOSIS — M170 Bilateral primary osteoarthritis of knee: Secondary | ICD-10-CM | POA: Diagnosis not present

## 2022-12-08 ENCOUNTER — Other Ambulatory Visit: Payer: Self-pay | Admitting: Orthopedic Surgery

## 2022-12-08 ENCOUNTER — Ambulatory Visit
Admission: RE | Admit: 2022-12-08 | Discharge: 2022-12-08 | Disposition: A | Discharge status: Home / Self Care | Payer: 59 | Source: Ambulatory Visit | Attending: Orthopedic Surgery | Admitting: Orthopedic Surgery

## 2022-12-08 DIAGNOSIS — R2242 Localized swelling, mass and lump, left lower limb: Secondary | ICD-10-CM

## 2023-01-17 ENCOUNTER — Emergency Department: Payer: 59

## 2023-01-17 ENCOUNTER — Telehealth: Payer: Self-pay | Admitting: Urology

## 2023-01-17 ENCOUNTER — Inpatient Hospital Stay
Admission: EM | Admit: 2023-01-17 | Discharge: 2023-01-20 | DRG: 661 | Disposition: A | Discharge status: Home / Self Care | Payer: 59 | Attending: Internal Medicine | Admitting: Internal Medicine

## 2023-01-17 ENCOUNTER — Other Ambulatory Visit: Payer: Self-pay

## 2023-01-17 ENCOUNTER — Ambulatory Visit
Admission: EM | Admit: 2023-01-17 | Discharge: 2023-01-17 | Disposition: A | Discharge status: Home / Self Care | Payer: 59 | Attending: Emergency Medicine | Admitting: Emergency Medicine

## 2023-01-17 DIAGNOSIS — Z882 Allergy status to sulfonamides status: Secondary | ICD-10-CM | POA: Diagnosis not present

## 2023-01-17 DIAGNOSIS — Z79899 Other long term (current) drug therapy: Secondary | ICD-10-CM | POA: Diagnosis not present

## 2023-01-17 DIAGNOSIS — Z6839 Body mass index (BMI) 39.0-39.9, adult: Secondary | ICD-10-CM

## 2023-01-17 DIAGNOSIS — E119 Type 2 diabetes mellitus without complications: Secondary | ICD-10-CM | POA: Diagnosis present

## 2023-01-17 DIAGNOSIS — M179 Osteoarthritis of knee, unspecified: Secondary | ICD-10-CM | POA: Diagnosis present

## 2023-01-17 DIAGNOSIS — Z7985 Long-term (current) use of injectable non-insulin antidiabetic drugs: Secondary | ICD-10-CM | POA: Diagnosis not present

## 2023-01-17 DIAGNOSIS — Z888 Allergy status to other drugs, medicaments and biological substances status: Secondary | ICD-10-CM

## 2023-01-17 DIAGNOSIS — R300 Dysuria: Secondary | ICD-10-CM | POA: Diagnosis not present

## 2023-01-17 DIAGNOSIS — R109 Unspecified abdominal pain: Secondary | ICD-10-CM | POA: Diagnosis not present

## 2023-01-17 DIAGNOSIS — Z803 Family history of malignant neoplasm of breast: Secondary | ICD-10-CM | POA: Diagnosis not present

## 2023-01-17 DIAGNOSIS — N12 Tubulo-interstitial nephritis, not specified as acute or chronic: Principal | ICD-10-CM

## 2023-01-17 DIAGNOSIS — N1330 Unspecified hydronephrosis: Secondary | ICD-10-CM

## 2023-01-17 DIAGNOSIS — Z880 Allergy status to penicillin: Secondary | ICD-10-CM | POA: Diagnosis not present

## 2023-01-17 DIAGNOSIS — K5900 Constipation, unspecified: Secondary | ICD-10-CM | POA: Diagnosis present

## 2023-01-17 DIAGNOSIS — R1031 Right lower quadrant pain: Secondary | ICD-10-CM

## 2023-01-17 DIAGNOSIS — N390 Urinary tract infection, site not specified: Secondary | ICD-10-CM

## 2023-01-17 DIAGNOSIS — R310 Gross hematuria: Secondary | ICD-10-CM

## 2023-01-17 DIAGNOSIS — M1990 Unspecified osteoarthritis, unspecified site: Secondary | ICD-10-CM | POA: Diagnosis present

## 2023-01-17 DIAGNOSIS — Z87442 Personal history of urinary calculi: Secondary | ICD-10-CM | POA: Diagnosis not present

## 2023-01-17 DIAGNOSIS — Z7984 Long term (current) use of oral hypoglycemic drugs: Secondary | ICD-10-CM

## 2023-01-17 DIAGNOSIS — R600 Localized edema: Secondary | ICD-10-CM | POA: Diagnosis present

## 2023-01-17 DIAGNOSIS — N200 Calculus of kidney: Secondary | ICD-10-CM | POA: Diagnosis not present

## 2023-01-17 DIAGNOSIS — N136 Pyonephrosis: Secondary | ICD-10-CM | POA: Diagnosis present

## 2023-01-17 DIAGNOSIS — Z885 Allergy status to narcotic agent status: Secondary | ICD-10-CM | POA: Diagnosis not present

## 2023-01-17 DIAGNOSIS — N201 Calculus of ureter: Secondary | ICD-10-CM

## 2023-01-17 DIAGNOSIS — N23 Unspecified renal colic: Secondary | ICD-10-CM

## 2023-01-17 LAB — POCT URINALYSIS DIP (MANUAL ENTRY)
Bilirubin, UA: NEGATIVE
Glucose, UA: NEGATIVE mg/dL
Ketones, POC UA: NEGATIVE mg/dL
Nitrite, UA: NEGATIVE
Protein Ur, POC: 100 mg/dL — AB
Spec Grav, UA: 1.015 (ref 1.010–1.025)
Urobilinogen, UA: 0.2 U/dL
pH, UA: 6 (ref 5.0–8.0)

## 2023-01-17 LAB — URINALYSIS, ROUTINE W REFLEX MICROSCOPIC
Bilirubin Urine: NEGATIVE
Glucose, UA: NEGATIVE mg/dL
Ketones, ur: NEGATIVE mg/dL
Nitrite: NEGATIVE
Protein, ur: 30 mg/dL — AB
RBC / HPF: >50 RBC/hpf (ref 0–5)
Specific Gravity, Urine: 1.008 (ref 1.005–1.030)
WBC, UA: >50 WBC/hpf (ref 0–5)
pH: 6 (ref 5.0–8.0)

## 2023-01-17 LAB — CBC
HCT: 37.8 % (ref 36.0–46.0)
Hemoglobin: 12.7 g/dL (ref 12.0–15.0)
MCH: 30.3 pg (ref 26.0–34.0)
MCHC: 33.6 g/dL (ref 30.0–36.0)
MCV: 90.2 fL (ref 80.0–100.0)
Platelets: 265 10*3/uL (ref 150–400)
RBC: 4.19 MIL/uL (ref 3.87–5.11)
RDW: 12.4 % (ref 11.5–15.5)
WBC: 14.2 10*3/uL — ABNORMAL HIGH (ref 4.0–10.5)
nRBC: 0 % (ref 0.0–0.2)

## 2023-01-17 LAB — COMPREHENSIVE METABOLIC PANEL
ALT: 18 U/L (ref 0–44)
AST: 19 U/L (ref 15–41)
Albumin: 4.1 g/dL (ref 3.5–5.0)
Alkaline Phosphatase: 72 U/L (ref 38–126)
Anion gap: 11 (ref 5–15)
BUN: 14 mg/dL (ref 6–20)
CO2: 21 mmol/L — ABNORMAL LOW (ref 22–32)
Calcium: 8.5 mg/dL — ABNORMAL LOW (ref 8.9–10.3)
Chloride: 105 mmol/L (ref 98–111)
Creatinine, Ser: 0.71 mg/dL (ref 0.44–1.00)
GFR, Estimated: >60 mL/min (ref >60)
Glucose, Bld: 106 mg/dL — ABNORMAL HIGH (ref 70–99)
Potassium: 3.7 mmol/L (ref 3.5–5.1)
Sodium: 137 mmol/L (ref 135–145)
Total Bilirubin: 0.6 mg/dL (ref 0.3–1.2)
Total Protein: 7 g/dL (ref 6.5–8.1)

## 2023-01-17 LAB — HIV ANTIBODY (ROUTINE TESTING W REFLEX): HIV Screen 4th Generation wRfx: NONREACTIVE

## 2023-01-17 MED ORDER — ACETAMINOPHEN 650 MG RE SUPP: 650.0000 mg | Freq: Four times a day (QID) | RECTAL | Status: DC | PRN

## 2023-01-17 MED ORDER — ONDANSETRON HCL 4 MG/2ML IJ SOLN
4.0000 mg | Freq: Once | INTRAMUSCULAR | Status: AC
Start: 2023-01-17 — End: 2023-01-17
  Administered 2023-01-17: 4 mg via INTRAVENOUS
  Filled 2023-01-17: qty 2

## 2023-01-17 MED ORDER — ONDANSETRON HCL 4 MG/2ML IJ SOLN
4.0000 mg | Freq: Four times a day (QID) | INTRAMUSCULAR | Status: DC | PRN
  Administered 2023-01-17 – 2023-01-19 (×3): 4 mg via INTRAVENOUS
  Filled 2023-01-17 (×2): qty 2

## 2023-01-17 MED ORDER — OXYCODONE HCL 5 MG PO TABS
5.0000 mg | ORAL_TABLET | Freq: Four times a day (QID) | ORAL | Status: AC | PRN
  Administered 2023-01-17 (×2): 5 mg via ORAL
  Filled 2023-01-17 (×3): qty 1

## 2023-01-17 MED ORDER — SENNOSIDES-DOCUSATE SODIUM 8.6-50 MG PO TABS
1.0000 | ORAL_TABLET | Freq: Every evening | ORAL | Status: DC | PRN
  Administered 2023-01-18: 1 via ORAL
  Filled 2023-01-17: qty 1

## 2023-01-17 MED ORDER — CIPROFLOXACIN IN D5W 400 MG/200ML IV SOLN
400.0000 mg | Freq: Two times a day (BID) | INTRAVENOUS | Status: DC
  Administered 2023-01-18 – 2023-01-19 (×3): 400 mg via INTRAVENOUS
  Filled 2023-01-17 (×4): qty 200

## 2023-01-17 MED ORDER — CIPROFLOXACIN IN D5W 400 MG/200ML IV SOLN
400.0000 mg | Freq: Once | INTRAVENOUS | Status: AC
  Administered 2023-01-17: 400 mg via INTRAVENOUS
  Filled 2023-01-17: qty 200

## 2023-01-17 MED ORDER — ONDANSETRON HCL 4 MG PO TABS
4.0000 mg | ORAL_TABLET | Freq: Four times a day (QID) | ORAL | Status: DC | PRN
  Administered 2023-01-20: 4 mg via ORAL
  Filled 2023-01-17: qty 1

## 2023-01-17 MED ORDER — KETOROLAC TROMETHAMINE 30 MG/ML IJ SOLN
15.0000 mg | Freq: Once | INTRAMUSCULAR | Status: AC
  Administered 2023-01-17: 15 mg via INTRAVENOUS
  Filled 2023-01-17: qty 1

## 2023-01-17 MED ORDER — DICLOFENAC SODIUM 1 % EX GEL: 2.0000 g | Freq: Four times a day (QID) | CUTANEOUS | Status: DC | PRN

## 2023-01-17 MED ORDER — MORPHINE SULFATE (PF) 2 MG/ML IV SOLN
2.0000 mg | INTRAVENOUS | Status: AC | PRN
  Administered 2023-01-17 (×2): 2 mg via INTRAVENOUS
  Filled 2023-01-17 (×3): qty 1

## 2023-01-17 MED ORDER — ACETAMINOPHEN 325 MG PO TABS
650.0000 mg | ORAL_TABLET | Freq: Four times a day (QID) | ORAL | Status: DC | PRN
  Administered 2023-01-18 – 2023-01-19 (×4): 650 mg via ORAL
  Filled 2023-01-17 (×4): qty 2

## 2023-01-17 NOTE — Discharge Instructions (Signed)
Go to the emergency department

## 2023-01-17 NOTE — H&P (View-Only) (Signed)
Urology Consult  Requesting physician: Chesley Noon, MD  Reason for consultation: Obstructing right UPJ calculus with possible infection  Chief Complaint: Kidney stone  History of Present Illness: Sherri Byrd is a 62 y.o. female admitted with an obstructing right UPJ calculus.    Approximately 1 week ago was having urinary frequency urgency and mild malaise.  She had an ED visit and was prescribed cefdinir for possible UTI however had GI upset and discontinued the medication Yesterday she had worsening right flank pain radiating to the right lower quadrant associated with nausea and vomiting.  She had subjective fever and chills Was seen in urgent care this morning and based on symptoms ED visit was recommended. Noted gross hematuria this morning Evaluation in the ED remarkable for temp 98.6 low was 99.3 in urgent care.  Urinalysis with >50 WBC/RBC and leukocytosis at 14.2. Renal stone CT was performed which showed severe right hydronephrosis secondary to a 16 mm right UPJ calculus She received parenteral analgesics with significant improvement in her pain which is minimal at present Prior history of stone disease with a previous failed SWL and subsequent PCNL by Dr. Achilles Dunk >10 years ago Started on IV Cipro in the ED  Past Medical History:  Diagnosis Date   Kidney stone     Past Surgical History:  Procedure Laterality Date   kidney stone remove     kidney stone stent Left     Home Medications:  No outpatient medications have been marked as taking for the 01/17/23 encounter [facility] Encounter).    Allergies:  Allergies  Allergen Reactions   Prednisone Shortness Of Breath    tachycardia   Meloxicam Other (See Comments)    LE edema   Penicillin G Hives   Sulfa Antibiotics Hives    Family History  Problem Relation Age of Onset   Breast cancer Maternal Aunt 100   Breast cancer Cousin 73       maternal    Social History:  reports that she has never smoked. She has  never used smokeless tobacco. She reports that she does not currently use alcohol. She reports that she does not use drugs.  ROS: A complete review of systems was performed.  All systems are negative except for pertinent findings as noted.  Physical Exam:  Vital signs in last 24 hours: Temp:  [98 F (36.7 C)-99.3 F (37.4 C)] 98.4 F (36.9 C) (10/28 1537) Pulse Rate:  [80-104] 80 (10/28 1537) Resp:  [15-18] 15 (10/28 1537) BP: (104-158)/(70-79) 136/79 (10/28 1537) SpO2:  [96 %-99 %] 98 % (10/28 1537) Weight:  [105.2 kg] 105.2 kg (10/28 1050) Constitutional:  Alert, No acute distress HEENT: [place]neads AT Cardiovascular: Regular rate and rhythm, no clubbing, cyanosis, or edema. Respiratory: Normal respiratory effort, lungs clear bilaterally GI: Abdomen is soft, nontender, nondistended, no abdominal masses GU: Mild right CVA tenderness Psychiatric: Normal mood and affect   Laboratory Data:  Recent Labs    01/17/23 1052  WBC 14.2*  HGB 12.7  HCT 37.8   Recent Labs    01/17/23 1052  NA 137  K 3.7  CL 105  CO2 21*  GLUCOSE 106*  BUN 14  CREATININE 0.71  CALCIUM 8.5*   No results for input(s): "LABPT", "INR" in the last 72 hours. No results for input(s): "LABURIN" in the last 72 hours. Results for orders placed or performed in visit on 10/01/21  Urine Culture     Status: None   Collection Time: 10/01/21 10:28 AM  Specimen: Urine  Result Value Ref Range Status   MICRO NUMBER: [id number]  Final   SPECIMEN QUALITY: Adequate  Final   Sample Source URINE  Final   STATUS: FINAL  Final   Result: No Growth  Final     Radiologic Imaging: CT images were personally reviewed and interpreted and agree with radiology interpretation  CT Renal Stone Study  Result Date: 01/17/2023 CLINICAL DATA:  Right flank and right lower quadrant pain. EXAM: CT ABDOMEN AND PELVIS WITHOUT CONTRAST TECHNIQUE: Multidetector CT imaging of the abdomen and pelvis was performed following the standard  protocol without IV contrast. RADIATION DOSE REDUCTION: This exam was performed according to the departmental dose-optimization program which includes automated exposure control, adjustment of the mA and/or kV according to patient size and/or use of iterative reconstruction technique. COMPARISON:  CT abdomen/pelvis dated December 01, 2007. Renal ultrasound dated October 02, 2021. FINDINGS: Lower chest: No acute abnormality. Hepatobiliary: No suspicious focal liver lesion, within the limits of an unenhanced exam. No gallstones, gallbladder wall thickening, or biliary dilatation. Pancreas: Unremarkable. No pancreatic ductal dilatation or surrounding inflammatory changes. Spleen: Normal in size without focal abnormality. Adrenals/Urinary Tract: The adrenal glands are unremarkable. There is a 1.6 cm obstructive calculus at the right ureteropelvic junction with severe upstream hydronephrosis. The distal right ureter tapers to normal caliber. No left-sided renal calculi or hydronephrosis. Renal cortical scarring at the interpolar left kidney. The bladder is unremarkable. Stomach/Bowel: Stomach is within normal limits. Appendix appears normal. No evidence of bowel wall thickening, distention, or inflammatory changes. Sigmoid colonic diverticulosis without evidence of acute diverticulitis. Vascular/Lymphatic: Aortic atherosclerosis. No enlarged abdominal or pelvic lymph nodes. Reproductive: Uterus and bilateral adnexa are unremarkable. Other: No abdominopelvic ascites. Musculoskeletal: No suspicious osseous lesion. No acute osseous abnormality. IMPRESSION: 1. 1.6 cm obstructive calculus at the right ureteropelvic junction with severe upstream hydronephrosis. 2. Sigmoid colonic diverticulosis without evidence of acute diverticulitis. Electronically Signed   By: Hart Robinsons M.D.   On: 01/17/2023 14:12    Impression/Recommendation:   1.  Right UPJ calculus Severe right hydronephrosis with significant pyuria,  leukocytosis indicative of infection Mild leukocytosis and low-grade temp Recommend continuing IV antibiotics and will plan for cystoscopy with right ureteral stent placement in a.m. Should she develop fever/clinical signs of sepsis we will perform stent placement earlier The procedure was discussed in detail including potential risks of bleeding, sepsis.  We discussed there is a small chance a stent may not be able to be placed due to stone impaction and in this event she would require percutaneous nephrostomy placement We discussed no attempt will be made to remove the stone due to probable infection and the risk of bacteremia/septic shock We discussed she will need a follow-up procedure for definitive stone treatment as the stone is too large to pass.  2.  Renal colic Secondary to above  3.  Severe hydronephrosis Secondary to above   01/17/2023, 5:55 PM  [name],  MD

## 2023-01-17 NOTE — Assessment & Plan Note (Signed)
Suspect secondary to right UPJ kidney stone with severe hydronephrosis Patient is status post Toradol 15 mg IV one-time dose per EDP.  Toradol will not be continued on admission due to anticipation of urologic procedure in the a.m. Oxycodone 5 mg p.o. every 6 hours as needed for moderate pain, 1 day ordered; morphine 2 mg IV every 4 hours as needed for severe pain, 20 hours ordered

## 2023-01-17 NOTE — ED Provider Notes (Signed)
[facility] Provider Note    Event Date/Time   First MD Initiated Contact with Patient 01/17/23 1059     (approximate)   History   Chief Complaint Flank Pain   HPI  Sherri Byrd is a 62 y.o. female with past medical history of kidney stones who presents to the ED complaining of flank pain.  Patient reports that since yesterday afternoon she has been having pain in her right flank rating towards the right lower quadrant of her abdomen and in her groin.  Pain is described as sharp, waxing and waning in severity but not exacerbated or alleviated by anything in particular.  It has been associated with nausea and a couple episodes of vomiting, but she denies any diarrhea.  She does state that she has had some blood in her urine today but denies any dysuria.  She has not had any fevers but does endorse chills today.  She describes symptoms as similar to prior kidney stones.     Physical Exam   Triage Vital Signs: ED Triage Vitals  Encounter Vitals Group     BP 01/17/23 1052 (!) 158/76     Systolic BP Percentile --      Diastolic BP Percentile --      Pulse Rate 01/17/23 1049 98     Resp 01/17/23 1049 18     Temp 01/17/23 1050 98.6 F (37 C)     Temp src --      SpO2 01/17/23 1049 99 %     Weight 01/17/23 1050 232 lb (105.2 kg)     Height 01/17/23 1050 5\' 4"  (1.626 m)     Head Circumference --      Peak Flow --      Pain Score 01/17/23 1050 9     Pain Loc --      Pain Education --      Exclude from Growth Chart --     Most recent vital signs: Vitals:   01/17/23 1052 01/17/23 1338  BP: (!) 158/76 (!) 150/70  Pulse:  88  Resp:  16  Temp:    SpO2:  99%    Constitutional: Alert and oriented. Eyes: Conjunctivae are normal. Head: Atraumatic. Nose: No congestion/rhinnorhea. Mouth/Throat: Mucous membranes are moist.  Cardiovascular: Normal rate, regular rhythm. Grossly normal heart sounds.  2+ radial pulses bilaterally. Respiratory: Normal  respiratory effort.  No retractions. Lungs CTAB. Gastrointestinal: Soft and nontender.  No CVA tenderness bilaterally.  No distention. Musculoskeletal: No lower extremity tenderness nor edema.  Neurologic:  Normal speech and language. No gross focal neurologic deficits are appreciated.    ED Results / Procedures / Treatments   Labs (all labs ordered are listed, but only abnormal results are displayed) Labs Reviewed  URINALYSIS, ROUTINE W REFLEX MICROSCOPIC - Abnormal; Notable for the following components:      Result Value   Color, Urine YELLOW (*)    APPearance CLOUDY (*)    Hgb urine dipstick LARGE (*)    Protein, ur 30 (*)    Leukocytes,Ua LARGE (*)    Bacteria, UA FEW (*)    All other components within normal limits  CBC - Abnormal; Notable for the following components:   WBC 14.2 (*)    All other components within normal limits  COMPREHENSIVE METABOLIC PANEL - Abnormal; Notable for the following components:   CO2 21 (*)    Glucose, Bld 106 (*)    Calcium 8.5 (*)    All other  components within normal limits  URINE CULTURE   RADIOLOGY CT renal protocol reviewed and interpreted by me with large right UPJ stone and hydronephrosis.  PROCEDURES:  Critical Care performed: No  Procedures   MEDICATIONS ORDERED IN ED: Medications  ciprofloxacin (CIPRO) IVPB 400 mg (has no administration in time range)  ketorolac (TORADOL) 30 MG/ML injection 15 mg (15 mg Intravenous Given 01/17/23 1125)  ondansetron (ZOFRAN) injection 4 mg (4 mg Intravenous Given 01/17/23 1125)     IMPRESSION / MDM / ASSESSMENT AND PLAN / ED COURSE  I reviewed the triage vital signs and the nursing notes.                              63 y.o. female with past medical history of kidney stones who presents to the ED with 24 hours of right flank pain rating towards her groin with hematuria.  Patient's presentation is most consistent with acute presentation with potential threat to life or bodily  function.  Differential diagnosis includes, but is not limited to, kidney stone, pyelonephritis, appendicitis, cystitis, AKI, electrolyte abnormality.  Patient uncomfortable but nontoxic-appearing and in no acute distress, vital signs are unremarkable.  Her abdominal exam is benign and she has no CVA tenderness, we will further assess with CT renal protocol for suspected kidney stone.  Labs and urinalysis are pending at this time, we will treat with IV Toradol and Zofran.  Labs show mild leukocytosis and urinalysis is concerning for infection, urine was sent for culture and we will start on IV Cipro given her penicillin allergy.  No significant electrolyte abnormality or AKI noted, LFTs are also unremarkable.  CT renal protocol shows 1.6 cm stone at the right UPJ with severe hydronephrosis.  Case discussed with Dr. Lonna Cobb of urology, who recommends admission to the hospitalist service for IV antibiotics, will tentatively plan on stent placement tomorrow morning unless patient were to decompensate.  Case discussed with hospitalist for admission.      FINAL CLINICAL IMPRESSION(S) / ED DIAGNOSES   Final diagnoses:  Pyelonephritis  Kidney stone     Rx / DC Orders   ED Discharge Orders     None        Note:  This document was prepared using Dragon voice recognition software and may include unintentional dictation errors.   Chesley Noon, MD 01/17/23 [id number]

## 2023-01-17 NOTE — Consult Note (Signed)
Urology Consult  Requesting physician: Chesley Noon, MD  Reason for consultation: Obstructing right UPJ calculus with possible infection  Chief Complaint: Kidney stone  History of Present Illness: Sherri Byrd is a 62 y.o. female admitted with an obstructing right UPJ calculus.    Approximately 1 week ago was having urinary frequency urgency and mild malaise.  She had an ED visit and was prescribed cefdinir for possible UTI however had GI upset and discontinued the medication Yesterday she had worsening right flank pain radiating to the right lower quadrant associated with nausea and vomiting.  She had subjective fever and chills Was seen in urgent care this morning and based on symptoms ED visit was recommended. Noted gross hematuria this morning Evaluation in the ED remarkable for temp 98.6 low was 99.3 in urgent care.  Urinalysis with >50 WBC/RBC and leukocytosis at 14.2. Renal stone CT was performed which showed severe right hydronephrosis secondary to a 16 mm right UPJ calculus She received parenteral analgesics with significant improvement in her pain which is minimal at present Prior history of stone disease with a previous failed SWL and subsequent PCNL by Dr. Achilles Dunk >10 years ago Started on IV Cipro in the ED  Past Medical History:  Diagnosis Date   Kidney stone     Past Surgical History:  Procedure Laterality Date   kidney stone remove     kidney stone stent Left     Home Medications:  No outpatient medications have been marked as taking for the 01/17/23 encounter [facility] Encounter).    Allergies:  Allergies  Allergen Reactions   Prednisone Shortness Of Breath    tachycardia   Meloxicam Other (See Comments)    LE edema   Penicillin G Hives   Sulfa Antibiotics Hives    Family History  Problem Relation Age of Onset   Breast cancer Maternal Aunt 100   Breast cancer Cousin 73       maternal    Social History:  reports that she has never smoked. She has  never used smokeless tobacco. She reports that she does not currently use alcohol. She reports that she does not use drugs.  ROS: A complete review of systems was performed.  All systems are negative except for pertinent findings as noted.  Physical Exam:  Vital signs in last 24 hours: Temp:  [98 F (36.7 C)-99.3 F (37.4 C)] 98.4 F (36.9 C) (10/28 1537) Pulse Rate:  [80-104] 80 (10/28 1537) Resp:  [15-18] 15 (10/28 1537) BP: (104-158)/(70-79) 136/79 (10/28 1537) SpO2:  [96 %-99 %] 98 % (10/28 1537) Weight:  [105.2 kg] 105.2 kg (10/28 1050) Constitutional:  Alert, No acute distress HEENT: [place] AT Cardiovascular: Regular rate and rhythm, no clubbing, cyanosis, or edema. Respiratory: Normal respiratory effort, lungs clear bilaterally GI: Abdomen is soft, nontender, nondistended, no abdominal masses GU: Mild right CVA tenderness Psychiatric: Normal mood and affect   Laboratory Data:  Recent Labs    01/17/23 1052  WBC 14.2*  HGB 12.7  HCT 37.8   Recent Labs    01/17/23 1052  NA 137  K 3.7  CL 105  CO2 21*  GLUCOSE 106*  BUN 14  CREATININE 0.71  CALCIUM 8.5*   No results for input(s): "LABPT", "INR" in the last 72 hours. No results for input(s): "LABURIN" in the last 72 hours. Results for orders placed or performed in visit on 10/01/21  Urine Culture     Status: None   Collection Time: 10/01/21 10:28 AM  Specimen: Urine  Result Value Ref Range Status   MICRO NUMBER: [id number]  Final   SPECIMEN QUALITY: Adequate  Final   Sample Source URINE  Final   STATUS: FINAL  Final   Result: No Growth  Final     Radiologic Imaging: CT images were personally reviewed and interpreted and agree with radiology interpretation  CT Renal Stone Study  Result Date: 01/17/2023 CLINICAL DATA:  Right flank and right lower quadrant pain. EXAM: CT ABDOMEN AND PELVIS WITHOUT CONTRAST TECHNIQUE: Multidetector CT imaging of the abdomen and pelvis was performed following the standard  protocol without IV contrast. RADIATION DOSE REDUCTION: This exam was performed according to the departmental dose-optimization program which includes automated exposure control, adjustment of the mA and/or kV according to patient size and/or use of iterative reconstruction technique. COMPARISON:  CT abdomen/pelvis dated December 01, 2007. Renal ultrasound dated October 02, 2021. FINDINGS: Lower chest: No acute abnormality. Hepatobiliary: No suspicious focal liver lesion, within the limits of an unenhanced exam. No gallstones, gallbladder wall thickening, or biliary dilatation. Pancreas: Unremarkable. No pancreatic ductal dilatation or surrounding inflammatory changes. Spleen: Normal in size without focal abnormality. Adrenals/Urinary Tract: The adrenal glands are unremarkable. There is a 1.6 cm obstructive calculus at the right ureteropelvic junction with severe upstream hydronephrosis. The distal right ureter tapers to normal caliber. No left-sided renal calculi or hydronephrosis. Renal cortical scarring at the interpolar left kidney. The bladder is unremarkable. Stomach/Bowel: Stomach is within normal limits. Appendix appears normal. No evidence of bowel wall thickening, distention, or inflammatory changes. Sigmoid colonic diverticulosis without evidence of acute diverticulitis. Vascular/Lymphatic: Aortic atherosclerosis. No enlarged abdominal or pelvic lymph nodes. Reproductive: Uterus and bilateral adnexa are unremarkable. Other: No abdominopelvic ascites. Musculoskeletal: No suspicious osseous lesion. No acute osseous abnormality. IMPRESSION: 1. 1.6 cm obstructive calculus at the right ureteropelvic junction with severe upstream hydronephrosis. 2. Sigmoid colonic diverticulosis without evidence of acute diverticulitis. Electronically Signed   By: Hart Robinsons M.D.   On: 01/17/2023 14:12    Impression/Recommendation:   1.  Right UPJ calculus Severe right hydronephrosis with significant pyuria,  leukocytosis indicative of infection Mild leukocytosis and low-grade temp Recommend continuing IV antibiotics and will plan for cystoscopy with right ureteral stent placement in a.m. Should she develop fever/clinical signs of sepsis we will perform stent placement earlier The procedure was discussed in detail including potential risks of bleeding, sepsis.  We discussed there is a small chance a stent may not be able to be placed due to stone impaction and in this event she would require percutaneous nephrostomy placement We discussed no attempt will be made to remove the stone due to probable infection and the risk of bacteremia/septic shock We discussed she will need a follow-up procedure for definitive stone treatment as the stone is too large to pass.  2.  Renal colic Secondary to above  3.  Severe hydronephrosis Secondary to above   01/17/2023, 5:55 PM  [name],  MD

## 2023-01-17 NOTE — ED Provider Notes (Signed)
Sherri Byrd    CSN: [id number] Arrival date & time: 01/17/23  4098      History   Chief Complaint Chief Complaint  Patient presents with   Hematuria    HPI Sherri Byrd is a 62 y.o. female.  Patient presents with lower abdominal pain, right flank pain, dysuria, hematuria, nausea, subjective fever, chills, since last night.  Two episodes of emesis.  She states her symptoms are similar to previous episodes of kidney stones.  No OTC medications taken.  No shortness of breath, chest pain, or other symptoms.  Her medical history includes pyelonephritis and kidney stones.  The history is provided by the patient and medical records.    Past Medical History:  Diagnosis Date   Kidney stone     Patient Active Problem List   Diagnosis Date Noted   Lower leg edema 12/12/2021   Annual physical exam 11/13/2021   Dysuria 11/13/2021   Acute pyelonephritis 10/01/2021   Pain of left heel 05/16/2020   Hip pain 11/02/2019   Obesity (BMI 35.0-39.9 without comorbidity) 11/02/2019   Edema 11/02/2019   Osteoarthritis of knee 01/28/2017    History reviewed. No pertinent surgical history.  OB History   No obstetric history on file.      Home Medications    Prior to Admission medications   Medication Sig Start Date End Date Taking? Authorizing Provider  phenazopyridine (PYRIDIUM) 200 MG tablet Take 200 mg by mouth 3 (three) times daily as needed. 01/09/23  Yes [provider]  cefdinir (OMNICEF) 300 MG capsule Take 300 mg by mouth every 12 (twelve) hours. Patient not taking: Reported on 01/17/2023 01/09/23   [provider]  ibuprofen (ADVIL) 800 MG tablet Take 1 tablet (800 mg total) by mouth 3 (three) times daily. 02/26/22   Kara Dies, NP  Semaglutide,0.25 or 0.5MG /DOS, (OZEMPIC, 0.25 OR 0.5 MG/DOSE,) 2 MG/3ML SOPN Inject 0.25 mg into the skin once a week. 02/26/22   Kara Dies, NP  spironolactone (ALDACTONE) 25 MG tablet Take 0.5 tablets  (12.5 mg total) by mouth daily. 12/11/21   Kara Dies, NP    Family History Family History  Problem Relation Age of Onset   Breast cancer Maternal Aunt 58   Breast cancer Cousin 16       maternal    Social History Social History   Tobacco Use   Smoking status: Never   Smokeless tobacco: Never  Vaping Use   Vaping status: Never Used  Substance Use Topics   Alcohol use: Not Currently   Drug use: Never     Allergies   Prednisone, Meloxicam, Penicillin g, and Sulfa antibiotics   Review of Systems Review of Systems  Constitutional:  Positive for chills and fever.  Respiratory:  Negative for cough and shortness of breath.   Cardiovascular:  Negative for chest pain and palpitations.  Gastrointestinal:  Positive for abdominal pain, nausea and vomiting. Negative for diarrhea.  Genitourinary:  Positive for dysuria, flank pain, frequency and hematuria.     Physical Exam Triage Vital Signs ED Triage Vitals  Encounter Vitals Group     BP --      Systolic BP Percentile --      Diastolic BP Percentile --      Pulse Rate 01/17/23 1013 (!) 104     Resp 01/17/23 1013 18     Temp 01/17/23 1013 99.3 F (37.4 C)     Temp src --      SpO2 01/17/23 1013 96 %  Weight --      Height --      Head Circumference --      Peak Flow --      Pain Score 01/17/23 1019 9     Pain Loc --      Pain Education --      Exclude from Growth Chart --    No data found.  Updated Vital Signs BP 104/72   Pulse (!) 104   Temp 99.3 F (37.4 C)   Resp 18   SpO2 96%   Visual Acuity Right Eye Distance:   Left Eye Distance:   Bilateral Distance:    Right Eye Near:   Left Eye Near:    Bilateral Near:     Physical Exam Constitutional:      General: She is not in acute distress.    Appearance: She is ill-appearing.  HENT:     Mouth/Throat:     Mouth: Mucous membranes are moist.  Cardiovascular:     Rate and Rhythm: Regular rhythm. Tachycardia present.     Heart sounds:  Normal heart sounds.  Pulmonary:     Effort: Pulmonary effort is normal. No respiratory distress.     Breath sounds: Normal breath sounds.  Abdominal:     General: Bowel sounds are normal.     Palpations: Abdomen is soft.     Tenderness: There is abdominal tenderness in the right lower quadrant and suprapubic area. There is right CVA tenderness. There is no guarding or rebound.  Skin:    General: Skin is warm and dry.  Neurological:     Mental Status: She is alert.      UC Treatments / Results  Labs (all labs ordered are listed, but only abnormal results are displayed) Labs Reviewed  POCT URINALYSIS DIP (MANUAL ENTRY) - Abnormal; Notable for the following components:      Result Value   Color, UA other (*)    Clarity, UA cloudy (*)    Blood, UA large (*)    Protein Ur, POC =100 (*)    Leukocytes, UA Large (3+) (*)    All other components within normal limits    EKG   Radiology No results found.  Procedures Procedures (including critical care time)  Medications Ordered in UC Medications - No data to display  Initial Impression / Assessment and Plan / UC Course  I have reviewed the triage vital signs and the nursing notes.  Pertinent labs & imaging results that were available during my care of the patient were reviewed by me and considered in my medical decision making (see chart for details).    Right lower quadrant abdominal pain, right flank pain, hematuria, dysuria.  Discussed limitations of evaluation of her symptoms in an urgent care setting.  Discussed treatment options.  Sending her to the emergency department for evaluation of her right lower quadrant abdominal pain, right flank pain, gross hematuria, and dysuria.  She is agreeable to this and feels stable to drive herself to [facility] ED.  Final Clinical Impressions(s) / UC Diagnoses   Final diagnoses:  RLQ abdominal pain  Right flank pain  Gross hematuria  Dysuria     Discharge Instructions       Go to the emergency department.      ED Prescriptions   None    PDMP not reviewed this encounter.   Mickie Bail, NP 01/17/23 1041

## 2023-01-17 NOTE — H&P (Signed)
History and Physical   Sherri Byrd VWU:[id number] DOB: May 28, 1960 DOA: 01/17/2023  PCP: Corky Downs, MD  Patient coming from: Urgent care via POV  I have personally briefly reviewed patient's old medical records in [facility] EMR.  Chief Concern: Right flank pain  HPI: Ms. Sherri Byrd is a 62 year old female with history of morbid obesity, non-insulin-dependent diabetes mellitus, osteoarthritis, who presents emergency department for chief concerns of right flank pain, hematuria, nausea.  Vitals in the ED showed temperature of 98.6, respiration rate 18, heart rate of 98, blood pressure 158/76, SpO2 of 99% on room air.  Serum sodium is 137, potassium 3.7, chloride 105, bicarb 21, BUN of 14, serum creatinine 0.71, EGFR greater than 60, nonfasting blood glucose 106, WBC 14.2, platelets of 265, hemoglobin 12.7.  UA was positive for large leukocytes and negative for nitrates.  Positive for few bacteria.  ED treatment: Toradol 15 mg IV one-time dose, ondansetron 4 mg IV. ------------------------------ At bedside, patient is able to tell me her name, age, current location, current year.  She reports right flank pain that started yesterday. She denies trauma to her person. She reports the pain got worse with laying down and blood in urine last evening. She endorses vomiting twice (last evening and today). She vomited up yellow bile both times.  She endorses chills and subjective fever last evening. She did not check her temperature.   Social history: She lives at home with her husband. She denies tobacco, etoh, recreational drug use. She works for [employer].  ROS: Constitutional: no weight change, + fever, + chills ENT/Mouth: no sore throat, no rhinorrhea Eyes: no eye pain, no vision changes Cardiovascular: no chest pain, no dyspnea,  no edema, no palpitations Respiratory: no cough, no sputum, no wheezing Gastrointestinal: + nausea, + vomiting, no diarrhea, no  constipation Genitourinary: no urinary incontinence, no dysuria, + hematuria Musculoskeletal: no arthralgias, no myalgias Skin: no skin lesions, no pruritus, Neuro: no weakness, no loss of consciousness, no syncope Psych: no anxiety, no depression, no decrease appetite Heme/Lymph: no bruising, no bleeding  ED Course: Discussed with emergency medicine provider, patient requiring hospitalization for chief concerns of right UPJ stone with severe hydronephrosis.  Assessment/Plan  Principal Problem:   Right kidney stone Active Problems:   Osteoarthritis of knee   Lower leg edema   Morbid obesity (HCC)   Acute right flank pain   Assessment and Plan:  * Right kidney stone Right ureteropelvic junction kidney stone approximately 1.6 cm, with severe hydronephrosis Urology service has been consulted by EDP and per EDP, plan for stone removal on 01/18/2023 Continue ciprofloxacin 400 mg every 12 hours N.p.o. after midnight Admit to MedSurg, inpatient  Acute right flank pain Suspect secondary to right UPJ kidney stone with severe hydronephrosis Patient is status post Toradol 15 mg IV one-time dose per EDP.  Toradol will not be continued on admission due to anticipation of urologic procedure in the a.m. Oxycodone 5 mg p.o. every 6 hours as needed for moderate pain, 1 day ordered; morphine 2 mg IV every 4 hours as needed for severe pain, 20 hours ordered  Morbid obesity (HCC) This complicates overall care and prognosis.   Osteoarthritis of knee As needed Voltaren gel 4 times daily as needed for joint pain ordered  Chart reviewed.   DVT prophylaxis: TED hose; AM team to initiate pharmacologic DVT prophylaxis when the benefits outweigh the risk Code Status: full code Diet: Heart healthy; n.p.o. after midnight Family Communication: updated spouse at bedside with patient's  permission Disposition Plan: Pending clinical course Consults called: Urology via EDP Admission status: MedSurg,  inpatient  Past Medical History:  Diagnosis Date   Kidney stone    Past Surgical History:  Procedure Laterality Date   kidney stone remove     kidney stone stent Left    Social History:  reports that she has never smoked. She has never used smokeless tobacco. She reports that she does not currently use alcohol. She reports that she does not use drugs.  Allergies  Allergen Reactions   Prednisone Shortness Of Breath    tachycardia   Meloxicam Other (See Comments)    LE edema   Penicillin G Hives   Sulfa Antibiotics Hives   Family History  Problem Relation Age of Onset   Breast cancer Maternal Aunt 70   Breast cancer Cousin 107       maternal   Family history: Family history reviewed and not pertinent.  Prior to Admission medications   Medication Sig Start Date End Date Taking? Authorizing Provider  cefdinir (OMNICEF) 300 MG capsule Take 300 mg by mouth every 12 (twelve) hours. Patient not taking: Reported on 01/17/2023 01/09/23   [provider]  ibuprofen (ADVIL) 800 MG tablet Take 1 tablet (800 mg total) by mouth 3 (three) times daily. 02/26/22   Kara Dies, NP  phenazopyridine (PYRIDIUM) 200 MG tablet Take 200 mg by mouth 3 (three) times daily as needed. 01/09/23   [provider]  Semaglutide,0.25 or 0.5MG /DOS, (OZEMPIC, 0.25 OR 0.5 MG/DOSE,) 2 MG/3ML SOPN Inject 0.25 mg into the skin once a week. 02/26/22   Kara Dies, NP  spironolactone (ALDACTONE) 25 MG tablet Take 0.5 tablets (12.5 mg total) by mouth daily. 12/11/21   Kara Dies, NP   Physical Exam: Vitals:   01/17/23 1052 01/17/23 1338 01/17/23 1453 01/17/23 1537  BP: (!) 158/76 (!) 150/70 (!) 148/70 136/79  Pulse:  88 90 80  Resp:  16 16 15   Temp:   98 F (36.7 C) 98.4 F (36.9 C)  TempSrc:   Oral Oral  SpO2:  99% 99% 98%  Weight:      Height:       Constitutional: appears age appropriate, NAD, calm Eyes: PERRL, lids and conjunctivae normal ENMT: Mucous membranes are  moist. Posterior pharynx clear of any exudate or lesions. Age-appropriate dentition. Hearing appropriate Neck: normal, supple, no masses, no thyromegaly Respiratory: clear to auscultation bilaterally, no wheezing, no crackles. Normal respiratory effort. No accessory muscle use.  Cardiovascular: Regular rate and rhythm, no murmurs / rubs / gallops. No extremity edema. 2+ pedal pulses. No carotid bruits.  Abdomen: obese abdomen, no tenderness, no masses palpated, no hepatosplenomegaly. Bowel sounds positive.  Musculoskeletal: no clubbing / cyanosis. No joint deformity upper and lower extremities. Good ROM, no contractures, no atrophy. Normal muscle tone.  Skin: no rashes, lesions, ulcers. No induration Neurologic: Sensation intact. Strength 5/5 in all 4.  Psychiatric: Normal judgment and insight. Alert and oriented x 3. Normal mood.   EKG: Not indicated at this time  Chest x-ray on Admission: Not indicated at this time  CT Renal Stone Study  Result Date: 01/17/2023 CLINICAL DATA:  Right flank and right lower quadrant pain. EXAM: CT ABDOMEN AND PELVIS WITHOUT CONTRAST TECHNIQUE: Multidetector CT imaging of the abdomen and pelvis was performed following the standard protocol without IV contrast. RADIATION DOSE REDUCTION: This exam was performed according to the departmental dose-optimization program which includes automated exposure control, adjustment of the mA and/or kV according to  patient size and/or use of iterative reconstruction technique. COMPARISON:  CT abdomen/pelvis dated December 01, 2007. Renal ultrasound dated October 02, 2021. FINDINGS: Lower chest: No acute abnormality. Hepatobiliary: No suspicious focal liver lesion, within the limits of an unenhanced exam. No gallstones, gallbladder wall thickening, or biliary dilatation. Pancreas: Unremarkable. No pancreatic ductal dilatation or surrounding inflammatory changes. Spleen: Normal in size without focal abnormality. Adrenals/Urinary Tract:  The adrenal glands are unremarkable. There is a 1.6 cm obstructive calculus at the right ureteropelvic junction with severe upstream hydronephrosis. The distal right ureter tapers to normal caliber. No left-sided renal calculi or hydronephrosis. Renal cortical scarring at the interpolar left kidney. The bladder is unremarkable. Stomach/Bowel: Stomach is within normal limits. Appendix appears normal. No evidence of bowel wall thickening, distention, or inflammatory changes. Sigmoid colonic diverticulosis without evidence of acute diverticulitis. Vascular/Lymphatic: Aortic atherosclerosis. No enlarged abdominal or pelvic lymph nodes. Reproductive: Uterus and bilateral adnexa are unremarkable. Other: No abdominopelvic ascites. Musculoskeletal: No suspicious osseous lesion. No acute osseous abnormality. IMPRESSION: 1. 1.6 cm obstructive calculus at the right ureteropelvic junction with severe upstream hydronephrosis. 2. Sigmoid colonic diverticulosis without evidence of acute diverticulitis. Electronically Signed   By: Hart Robinsons M.D.   On: 01/17/2023 14:12    Labs on Admission: I have personally reviewed following labs  CBC: Recent Labs  Lab 01/17/23 1052  WBC 14.2*  HGB 12.7  HCT 37.8  MCV 90.2  PLT 265   Basic Metabolic Panel: Recent Labs  Lab 01/17/23 1052  NA 137  K 3.7  CL 105  CO2 21*  GLUCOSE 106*  BUN 14  CREATININE 0.71  CALCIUM 8.5*   GFR: Estimated Creatinine Clearance: 88.4 mL/min (by C-G formula based on SCr of 0.71 mg/dL).  Liver Function Tests: Recent Labs  Lab 01/17/23 1052  AST 19  ALT 18  ALKPHOS 72  BILITOT 0.6  PROT 7.0  ALBUMIN 4.1   Urine analysis:    Component Value Date/Time   COLORURINE YELLOW (A) 01/17/2023 1052   APPEARANCEUR CLOUDY (A) 01/17/2023 1052   LABSPEC 1.008 01/17/2023 1052   PHURINE 6.0 01/17/2023 1052   GLUCOSEU NEGATIVE 01/17/2023 1052   HGBUR LARGE (A) 01/17/2023 1052   BILIRUBINUR NEGATIVE 01/17/2023 1052   BILIRUBINUR  negative 01/17/2023 1008   BILIRUBINUR neg 11/13/2021 1537   KETONESUR NEGATIVE 01/17/2023 1052   KETONESUR negative 01/17/2023 1008   PROTEINUR 30 (A) 01/17/2023 1052   PROTEINUR =100 (A) 01/17/2023 1008   UROBILINOGEN 0.2 01/17/2023 1008   NITRITE NEGATIVE 01/17/2023 1052   NITRITE Negative 01/17/2023 1008   LEUKOCYTESUR LARGE (A) 01/17/2023 1052   LEUKOCYTESUR Large (3+) (A) 01/17/2023 1008   This document was prepared using Dragon Voice Recognition software and may include unintentional dictation errors.  Dr. Sedalia Muta [facility]  If 7PM-7AM, please contact overnight-coverage provider If 7AM-7PM, please contact day attending provider [email or web address]  01/17/2023, 4:37 PM

## 2023-01-17 NOTE — ED Notes (Signed)
Patient is being discharged from the Urgent Care and sent to the Emergency Department via POV . Per Wendee Beavers, patient is in need of higher level of care due to RLQ pain, right sided flank pain, fever. Patient is aware and verbalizes understanding of plan of care.  Vitals:   01/17/23 1013 01/17/23 1019  BP:  104/72  Pulse: (!) 104   Resp: 18   Temp: 99.3 F (37.4 C)   SpO2: 96%

## 2023-01-17 NOTE — ED Triage Notes (Signed)
Pt to ED for right sided flank pain and hematuria started yesterday. +emesis.

## 2023-01-17 NOTE — Assessment & Plan Note (Signed)
As needed Voltaren gel 4 times daily as needed for joint pain ordered

## 2023-01-17 NOTE — Hospital Course (Signed)
Ms. Sherri Byrd is a 62 year old female with history of morbid obesity, non-insulin-dependent diabetes mellitus, osteoarthritis, who presents emergency department for chief concerns of right flank pain, hematuria, nausea.  Vitals in the ED showed temperature of 98.6, respiration rate 18, heart rate of 98, blood pressure 158/76, SpO2 of 99% on room air.  Serum sodium is 137, potassium 3.7, chloride 105, bicarb 21, BUN of 14, serum creatinine 0.71, EGFR greater than 60, nonfasting blood glucose 106, WBC 14.2, platelets of 265, hemoglobin 12.7.  UA was positive for large leukocytes and negative for nitrates.  Positive for few bacteria.  ED treatment: Toradol 15 mg IV one-time dose, ondansetron 4 mg IV.

## 2023-01-17 NOTE — ED Triage Notes (Signed)
Patient to Urgent Care with complaints of hematuria/ nausea/ right sided flank pain that radiates into her groin . Possible fever last night. Urinary frequency and urgency.   Symptoms started last night. Reports hx of x2 Kidney stones.   Prescribed Cefidinr 10/20 via MD live but was unable to complete course d/t HA.

## 2023-01-17 NOTE — ED Notes (Signed)
See triage note  Presents with right flank and RLQ pain  States this pain started yesterday  Hx of renal stones in the past  slight nausea

## 2023-01-17 NOTE — Assessment & Plan Note (Addendum)
Right ureteropelvic junction kidney stone approximately 1.6 cm, with severe hydronephrosis Urology service has been consulted by EDP and per EDP, plan for stone removal on 01/18/2023 Continue ciprofloxacin 400 mg every 12 hours N.p.o. after midnight Admit to MedSurg, inpatient

## 2023-01-17 NOTE — Assessment & Plan Note (Signed)
-   This complicates overall care and prognosis.  

## 2023-01-18 ENCOUNTER — Inpatient Hospital Stay: Payer: 59

## 2023-01-18 ENCOUNTER — Inpatient Hospital Stay: Payer: 59 | Admitting: Anesthesiology

## 2023-01-18 ENCOUNTER — Encounter
Admission: EM | Disposition: A | Discharge status: Home / Self Care | Payer: Self-pay | Source: Home / Self Care | Attending: Internal Medicine

## 2023-01-18 ENCOUNTER — Encounter: Payer: Self-pay | Admitting: Internal Medicine

## 2023-01-18 DIAGNOSIS — N200 Calculus of kidney: Secondary | ICD-10-CM | POA: Diagnosis not present

## 2023-01-18 HISTORY — PX: CYSTOSCOPY WITH STENT PLACEMENT: SHX5790

## 2023-01-18 LAB — URINE CULTURE: Culture: <10000 — AB

## 2023-01-18 LAB — BASIC METABOLIC PANEL
Anion gap: 6 (ref 5–15)
BUN: 18 mg/dL (ref 6–20)
CO2: 25 mmol/L (ref 22–32)
Calcium: 7.9 mg/dL — ABNORMAL LOW (ref 8.9–10.3)
Chloride: 105 mmol/L (ref 98–111)
Creatinine, Ser: 0.75 mg/dL (ref 0.44–1.00)
GFR, Estimated: >60 mL/min (ref >60)
Glucose, Bld: 120 mg/dL — ABNORMAL HIGH (ref 70–99)
Potassium: 3.5 mmol/L (ref 3.5–5.1)
Sodium: 136 mmol/L (ref 135–145)

## 2023-01-18 LAB — CBC
HCT: 33.7 % — ABNORMAL LOW (ref 36.0–46.0)
Hemoglobin: 11.1 g/dL — ABNORMAL LOW (ref 12.0–15.0)
MCH: 30.2 pg (ref 26.0–34.0)
MCHC: 32.9 g/dL (ref 30.0–36.0)
MCV: 91.8 fL (ref 80.0–100.0)
Platelets: 210 10*3/uL (ref 150–400)
RBC: 3.67 MIL/uL — ABNORMAL LOW (ref 3.87–5.11)
RDW: 12.7 % (ref 11.5–15.5)
WBC: 7.3 10*3/uL (ref 4.0–10.5)
nRBC: 0 % (ref 0.0–0.2)

## 2023-01-18 SURGERY — CYSTOSCOPY, WITH STENT INSERTION
Anesthesia: General | Laterality: Right

## 2023-01-18 MED ORDER — OXYCODONE HCL 5 MG PO TABS
5.0000 mg | ORAL_TABLET | ORAL | Status: DC | PRN
  Administered 2023-01-18 – 2023-01-19 (×3): 5 mg via ORAL
  Filled 2023-01-18 (×2): qty 1

## 2023-01-18 MED ORDER — DEXAMETHASONE SODIUM PHOSPHATE 10 MG/ML IJ SOLN
INTRAMUSCULAR | Status: DC | PRN
  Administered 2023-01-18: 10 mg via INTRAVENOUS

## 2023-01-18 MED ORDER — MIDAZOLAM HCL 2 MG/2ML IJ SOLN
INTRAMUSCULAR | Status: DC | PRN
  Administered 2023-01-18: 2 mg via INTRAVENOUS

## 2023-01-18 MED ORDER — DEXAMETHASONE SODIUM PHOSPHATE 10 MG/ML IJ SOLN
INTRAMUSCULAR | Status: AC
  Filled 2023-01-18: qty 1

## 2023-01-18 MED ORDER — SUCCINYLCHOLINE CHLORIDE 200 MG/10ML IV SOSY
PREFILLED_SYRINGE | INTRAVENOUS | Status: AC
Start: 2023-01-18 — End: ?
  Filled 2023-01-18: qty 10

## 2023-01-18 MED ORDER — LIDOCAINE HCL (CARDIAC) PF 100 MG/5ML IV SOSY
PREFILLED_SYRINGE | INTRAVENOUS | Status: DC | PRN
  Administered 2023-01-18: 100 mg via INTRAVENOUS

## 2023-01-18 MED ORDER — KETOROLAC TROMETHAMINE 30 MG/ML IJ SOLN
30.0000 mg | Freq: Four times a day (QID) | INTRAMUSCULAR | Status: DC | PRN
  Administered 2023-01-18 – 2023-01-20 (×4): 30 mg via INTRAVENOUS
  Filled 2023-01-18 (×4): qty 1

## 2023-01-18 MED ORDER — FENTANYL CITRATE (PF) 100 MCG/2ML IJ SOLN
INTRAMUSCULAR | Status: AC
  Filled 2023-01-18: qty 2

## 2023-01-18 MED ORDER — SUCCINYLCHOLINE CHLORIDE 200 MG/10ML IV SOSY
PREFILLED_SYRINGE | INTRAVENOUS | Status: DC | PRN
  Administered 2023-01-18: 120 mg via INTRAVENOUS

## 2023-01-18 MED ORDER — IOHEXOL 180 MG/ML  SOLN
INTRAMUSCULAR | Status: DC | PRN
  Administered 2023-01-18: 10 mL
  Administered 2023-01-18: 20 mL

## 2023-01-18 MED ORDER — OXYBUTYNIN CHLORIDE 5 MG PO TABS: 5.0000 mg | ORAL_TABLET | Freq: Three times a day (TID) | ORAL | Status: DC | PRN

## 2023-01-18 MED ORDER — FENTANYL CITRATE (PF) 100 MCG/2ML IJ SOLN
INTRAMUSCULAR | Status: DC | PRN
  Administered 2023-01-18: 50 ug via INTRAVENOUS

## 2023-01-18 MED ORDER — ONDANSETRON HCL 4 MG/2ML IJ SOLN
INTRAMUSCULAR | Status: DC | PRN
  Administered 2023-01-18: 4 mg via INTRAVENOUS

## 2023-01-18 MED ORDER — OXYCODONE HCL 5 MG/5ML PO SOLN
5.0000 mg | Freq: Once | ORAL | Status: DC | PRN
Start: 2023-01-18 — End: 2023-01-18

## 2023-01-18 MED ORDER — ONDANSETRON HCL 4 MG/2ML IJ SOLN
INTRAMUSCULAR | Status: AC
  Filled 2023-01-18: qty 4

## 2023-01-18 MED ORDER — LIDOCAINE HCL (PF) 2 % IJ SOLN
INTRAMUSCULAR | Status: AC
  Filled 2023-01-18: qty 5

## 2023-01-18 MED ORDER — PROPOFOL 10 MG/ML IV BOLUS
INTRAVENOUS | Status: DC | PRN
  Administered 2023-01-18: 150 mg via INTRAVENOUS

## 2023-01-18 MED ORDER — ONDANSETRON HCL 4 MG/2ML IJ SOLN: 4.0000 mg | Freq: Once | INTRAMUSCULAR | Status: DC | PRN

## 2023-01-18 MED ORDER — ACETAMINOPHEN 10 MG/ML IV SOLN: 1000.0000 mg | Freq: Once | INTRAVENOUS | Status: DC | PRN

## 2023-01-18 MED ORDER — SODIUM CHLORIDE 0.9 % IR SOLN
Status: DC | PRN
  Administered 2023-01-18: 1000 mL

## 2023-01-18 MED ORDER — PROPOFOL 10 MG/ML IV BOLUS
INTRAVENOUS | Status: AC
  Filled 2023-01-18: qty 20

## 2023-01-18 MED ORDER — OXYCODONE HCL 5 MG PO TABS: 5.0000 mg | ORAL_TABLET | Freq: Once | ORAL | Status: DC | PRN

## 2023-01-18 MED ORDER — LACTATED RINGERS IV SOLN: INTRAVENOUS | Status: DC | PRN

## 2023-01-18 MED ORDER — FENTANYL CITRATE (PF) 100 MCG/2ML IJ SOLN: 25.0000 ug | INTRAMUSCULAR | Status: DC | PRN

## 2023-01-18 MED ORDER — ENOXAPARIN SODIUM 40 MG/0.4ML IJ SOSY
40.0000 mg | PREFILLED_SYRINGE | INTRAMUSCULAR | Status: DC
  Administered 2023-01-19 – 2023-01-20 (×2): 40 mg via SUBCUTANEOUS
  Filled 2023-01-18 (×2): qty 0.4

## 2023-01-18 MED ORDER — MIDAZOLAM HCL 2 MG/2ML IJ SOLN
INTRAMUSCULAR | Status: AC
  Filled 2023-01-18: qty 2

## 2023-01-18 SURGICAL SUPPLY — 14 items
BAG DRAIN SIEMENS DORNER NS (MISCELLANEOUS) ×1 IMPLANT
BAG DRN NS LF (MISCELLANEOUS) ×1
BRUSH SCRUB EZ 4% CHG (MISCELLANEOUS) IMPLANT
GLOVE BIOGEL PI IND STRL 7.5 (GLOVE) ×1 IMPLANT
GOWN STRL REUS W/ TWL XL LVL3 (GOWN DISPOSABLE) ×1 IMPLANT
GOWN STRL REUS W/TWL XL LVL3 (GOWN DISPOSABLE) ×1
GUIDEWIRE STR DUAL SENSOR (WIRE) ×1 IMPLANT
IV NS IRRIG 3000ML ARTHROMATIC (IV SOLUTION) ×1 IMPLANT
KIT TURNOVER CYSTO (KITS) ×1 IMPLANT
PACK CYSTO AR (MISCELLANEOUS) ×1 IMPLANT
SET CYSTO W/LG BORE CLAMP LF (SET/KITS/TRAYS/PACK) ×1 IMPLANT
STENT URET 6FRX24 CONTOUR (STENTS) IMPLANT
SURGILUBE 2OZ TUBE FLIPTOP (MISCELLANEOUS) ×1 IMPLANT
WATER STERILE IRR 500ML POUR (IV SOLUTION) ×1 IMPLANT

## 2023-01-18 NOTE — Addendum Note (Signed)
Addendum  created 01/18/23 1231 by Jarid Sasso, [place], CRNA   Clinical Note Signed, Intraprocedure Blocks edited, SmartForm saved

## 2023-01-18 NOTE — Op Note (Signed)
   Preoperative diagnosis:  Right UPJ calculus with severe hydronephrosis Urinary tract infection  Postoperative diagnosis:  Same  Procedure:  Cystoscopy Right ureteral stent placement (51F/24 cm)  Right retrograde pyelography with interpretation   Surgeon: Lorin Picket C. Daren Yeagle, M.D.  Anesthesia: General  Complications: None  Intraoperative findings:  Cystoscopy: Scattered mucosal erythema endoscopically consistent with inflammation.  UOs normal-appearing bilaterally.  No solid or papillary lesions Right retrograde pyelogram: Proximal ureteral calculus visualized on fluoroscopy.  Severe right hydronephrosis secondary to obstructing stone  EBL: Minimal  Specimens: Urine right renal pelvis for culture/sensitivity  Indication: Sherri Byrd is a 62 y.o. female admitted with right flank pain, nausea, vomiting and low-grade fever.  CT showed a 16 mm right UPJ calculus with severe hydronephrosis.  Urinalysis with pyuria and mild leukocytosis on CBC.  After reviewing the management options for treatment, he elected to proceed with the above surgical procedure(s). We have discussed the potential benefits and risks of the procedure, side effects of the proposed treatment, the likelihood of the patient achieving the goals of the procedure, and any potential problems that might occur during the procedure or recuperation. Informed consent has been obtained.  Description of procedure:  The patient was taken to the operating room and general anesthesia was induced.  The patient was placed in the dorsal lithotomy position, prepped and draped in the usual sterile fashion, and preoperative antibiotics were administered. A preoperative time-out was performed.   A 21 French cystoscope sheath with obturator was lubricated and passed per urethra.  The obturator was replaced with a 30 degree lens and panendoscopy was performed with findings as described above.  Attention then turned to the right ureteral  orifice and a 41F open-ended ureteral catheter was used to intubate the ureteral orifice.  Omnipaque contrast was injected through the ureteral catheter and a retrograde pyelogram was performed with findings as dictated above.  A 0.038 Sensor guidewire was then placed through the ureteral catheter and advanced up the ureter into the renal pelvis under fluoroscopic guidance.  The guidewire was removed and 10 cc of slightly cloudy urine was aspirated and sent for culture.  The guidewire was then replaced and the ureteral catheter was removed.  A 51F/24 cm Contour ureteral stent was then advanced over the guidewire under fluoroscopic guidance.  The proximal curl was positioned in the renal pelvis and the distal end the stent was well-positioned in the bladder.  The bladder was then emptied and the procedure ended.  The patient appeared to tolerate the procedure well and without complications.  After anesthetic reversal she was transported to the PACU in stable condition.   [name], MD

## 2023-01-18 NOTE — Plan of Care (Signed)
  Problem: Education: Goal: Knowledge of General Education information will improve Description: Including pain rating scale, medication(s)/side effects and non-pharmacologic comfort measures Outcome: Progressing   Problem: Nutrition: Goal: Adequate nutrition will be maintained Outcome: Progressing   Problem: Coping: Goal: Level of anxiety will decrease Outcome: Progressing   Problem: Elimination: Goal: Will not experience complications related to bowel motility Outcome: Progressing Goal: Will not experience complications related to urinary retention Outcome: Progressing   Problem: Pain Management: Goal: General experience of comfort will improve Outcome: Progressing   Problem: Safety: Goal: Ability to remain free from injury will improve Outcome: Progressing

## 2023-01-18 NOTE — Transfer of Care (Signed)
Immediate Anesthesia Transfer of Care Note  Patient: Sherri Byrd  Procedure(s) Performed: CYSTOSCOPY WITH RIGHT URETERAL STENT PLACEMENT (Right)  Patient Location: PACU  Anesthesia Type:General  Level of Consciousness: drowsy  Airway & Oxygen Therapy: Patient Spontanous Breathing and Patient connected to face mask oxygen  Post-op Assessment: Report given to RN and Post -op Vital signs reviewed and stable  Post vital signs: Reviewed and stable  Last Vitals:  Vitals Value Taken Time  BP 111/67 01/18/23 0913  Temp 36.3 C 01/18/23 0910  Pulse 90 01/18/23 0915  Resp 17 01/18/23 0915  SpO2 96 % 01/18/23 0915  Vitals shown include unfiled device data.  Last Pain:  Vitals:   01/18/23 0910  TempSrc:   PainSc: 0-No pain      Patients Stated Pain Goal: 3 (01/17/23 1931)  Complications: No notable events documented.

## 2023-01-18 NOTE — Interval H&P Note (Signed)
History and Physical Interval Note:  01/18/2023 8:17 AM  Sherri Byrd  has presented today for surgery, with the diagnosis of obstruction.  The various methods of treatment have been discussed with the patient and family. After consideration of risks, benefits and other options for treatment, the patient has consented to  Procedure(s): CYSTOSCOPY WITH RIGHT URETERAL STENT PLACEMENT (Right) as a surgical intervention.  The patient's history has been reviewed, patient examined, no change in status, stable for surgery.  I have reviewed the patient's chart and labs.  Questions were answered to the patient's satisfaction.    CV: RRR Lungs: Clear  Windel Keziah C Vadim Centola

## 2023-01-18 NOTE — Telephone Encounter (Signed)
error 

## 2023-01-18 NOTE — Anesthesia Postprocedure Evaluation (Signed)
Anesthesia Post Note  Patient: Sherri Byrd  Procedure(s) Performed: CYSTOSCOPY WITH RIGHT URETERAL STENT PLACEMENT (Right)  Patient location during evaluation: PACU Anesthesia Type: General Level of consciousness: awake and alert Pain management: pain level controlled Vital Signs Assessment: post-procedure vital signs reviewed and stable Respiratory status: spontaneous breathing, nonlabored ventilation, respiratory function stable and patient connected to nasal cannula oxygen Cardiovascular status: blood pressure returned to baseline and stable Postop Assessment: no apparent nausea or vomiting Anesthetic complications: no   No notable events documented.   Last Vitals:  Vitals:   01/18/23 0915 01/18/23 0916  BP: 111/67 130/68  Pulse: 90 91  Resp: 17 18  Temp:    SpO2: 96% 97%    Last Pain:  Vitals:   01/18/23 0910  TempSrc:   PainSc: 0-No pain                 Corinda Gubler

## 2023-01-18 NOTE — TOC CM/SW Note (Signed)
Transition of Care [facility]) - Inpatient Brief Assessment   Patient Details  Name: Sherri Byrd MRN: [id number] Date of Birth: November 21, 1960  Transition of Care [facility]) CM/SW Contact:    Chapman Fitch, RN Phone Number: 01/18/2023, 2:28 PM   Clinical Narrative:   Transition of Care [facility]) Screening Note   Patient Details  Name: [name] Date of Birth: May 30, 1960   Transition of Care [facility]) CM/SW Contact:    Chapman Fitch, RN Phone Number: 01/18/2023, 2:28 PM    Transition of Care Department [facility]) has reviewed patient and no TOC needs have been identified at this time. If new patient transition needs arise, please place a TOC consult.    Transition of Care Asessment: Insurance and Status: Insurance coverage has been reviewed Patient has primary care physician: Yes     Prior/Current Home Services: No current home services Social Determinants of Health Reivew: SDOH reviewed no interventions necessary Readmission risk has been reviewed: Yes Transition of care needs: no transition of care needs at this time

## 2023-01-18 NOTE — Progress Notes (Addendum)
Progress Note   Patient: Sherri Byrd NWG:[id number] DOB: [birth year] DOA: 01/17/2023     1 DOS: the patient was seen and examined on 01/18/2023   Brief hospital course: Chief Concern: Right flank pain   HPI: Ms. Sherri Byrd is a 62 year old female with history of morbid obesity, non-insulin-dependent diabetes mellitus, osteoarthritis, who presents emergency department for chief concerns of right flank pain, hematuria, nausea.   Vitals in the ED showed temperature of 98.6, respiration rate 18, heart rate of 98, blood pressure 158/76, SpO2 of 99% on room air.   Serum sodium is 137, potassium 3.7, chloride 105, bicarb 21, BUN of 14, serum creatinine 0.71, EGFR greater than 60, nonfasting blood glucose 106, WBC 14.2, platelets of 265, hemoglobin 12.7.   UA was positive for large leukocytes and negative for nitrates.  Positive for few bacteria.   ED treatment: Toradol 15 mg IV one-time dose, ondansetron 4 mg IV. ------------------------------ At bedside, patient is able to tell me her name, age, current location, current year.   She reports right flank pain that started yesterday. She denies trauma to her person. She reports the pain got worse with laying down and blood in urine last evening. She endorses vomiting twice (last evening and today). She vomited up yellow bile both times.   She endorses chills and subjective fever last evening. She did not check her temperature.    Social history: She lives at home with her husband. She denies tobacco, etoh, recreational drug use. She works for [employer].      Assessment and Plan:  Obstructive nephrolithiasis Right UPJ calculus with severe hydronephrosis UTI Patient is status post cystoscopy with right ureteral stent placement Continue empiric antibiotic therapy with ciprofloxacin for pyuria suggestive of a possible infection Pain control    Morbid obesity BMI 39 Complicates overall prognosis and care Lifestyle education and  exercise has been discussed with patient in detail         Subjective: Patient is seen and examined in PACU.   Physical Exam: Vitals:   01/18/23 0945 01/18/23 0959 01/18/23 1030 01/18/23 1116  BP: 136/61 119/60 (!) 109/52 (!) 114/52  Pulse: 83 72 83 86  Resp: 14 18 20 20   Temp: 98.4 F (36.9 C) 98 F (36.7 C) 98 F (36.7 C) 98.3 F (36.8 C)  TempSrc:  Oral Oral Oral  SpO2: 97% 99% 98% 98%  Weight:      Height:        Constitutional: appears age appropriate, NAD, calm Eyes: PERRL, lids and conjunctivae normal ENMT: Mucous membranes are moist. Posterior pharynx clear of any exudate or lesions. Age-appropriate dentition. Hearing appropriate Neck: normal, supple, no masses, no thyromegaly Respiratory: clear to auscultation bilaterally, no wheezing, no crackles. Normal respiratory effort. No accessory muscle use.  Cardiovascular: Regular rate and rhythm, no murmurs / rubs / gallops. No extremity edema. 2+ pedal pulses. No carotid bruits.  Abdomen: Central adiposity, no tenderness, no masses palpated, no hepatosplenomegaly. Bowel sounds positive.  Musculoskeletal: no clubbing / cyanosis. No joint deformity upper and lower extremities. Good ROM, no contractures, no atrophy. Normal muscle tone.  Skin: no rashes, lesions, ulcers. No induration Neurologic: Sensation intact. Strength 5/5 in all 4.  Psychiatric: Normal judgment and insight. Alert and oriented x 3. Normal mood.    Data Reviewed: Labs reviewed. 14.2 >> 7.3 There are no new results to review at this time.  Family Communication: Plan of care discussed with patient  Disposition: Status is: Inpatient Remains inpatient appropriate because: Pain  control, IV antibiotics  Planned Discharge Destination: Home    Time spent: 33 minutes  Author: Lucile Shutters, MD 01/18/2023 12:29 PM  For on call review [email or web address].

## 2023-01-18 NOTE — Anesthesia Procedure Notes (Addendum)
Procedure Name: Intubation Date/Time: 01/18/2023 8:37 AM  Performed by: Santresa Levett, [place], CRNAPre-anesthesia Checklist: Patient identified, Patient being monitored, Timeout performed, Emergency Drugs available and Suction available Patient Re-evaluated:Patient Re-evaluated prior to induction Oxygen Delivery Method: Circle system utilized Preoxygenation: Pre-oxygenation with 100% oxygen Induction Type: IV induction and Rapid sequence Laryngoscope Size: 3 and McGraph Grade View: Grade I Tube type: Oral Tube size: 7.0 mm Number of attempts: 1 Airway Equipment and Method: Stylet Placement Confirmation: ETT inserted through vocal cords under direct vision, positive ETCO2 and breath sounds checked- equal and bilateral Secured at: 21 cm Tube secured with: Tape Dental Injury: Teeth and Oropharynx as per pre-operative assessment

## 2023-01-18 NOTE — Anesthesia Preprocedure Evaluation (Signed)
Anesthesia Evaluation  Patient identified by MRN, date of birth, ID band Patient awake  General Assessment Comment:  Obstructing infected ureteral stone. Had nausea and vomiting yesterday. Reports nausea this morning  Reviewed: Allergy & Precautions, NPO status , Patient's Chart, lab work & pertinent test results  History of Anesthesia Complications Negative for: history of anesthetic complications  Airway Mallampati: III  TM Distance: >3 FB Neck ROM: Full    Dental  (+) Upper Dentures   Pulmonary neg pulmonary ROS, neg sleep apnea, neg COPD, Patient abstained from smoking.Not current smoker   Pulmonary exam normal breath sounds clear to auscultation       Cardiovascular Exercise Tolerance: Good METS(-) hypertension(-) CAD and (-) Past MI negative cardio ROS (-) dysrhythmias  Rhythm:Regular Rate:Normal - Systolic murmurs    Neuro/Psych negative neurological ROS  negative psych ROS   GI/Hepatic ,neg GERD  ,,(+)     (-) substance abuse    Endo/Other  neg diabetes  No longer takes GLP1 agonist  Renal/GU Renal disease     Musculoskeletal  (+) Arthritis ,    Abdominal   Peds  Hematology   Anesthesia Other Findings Past Medical History: No date: Kidney stone  Reproductive/Obstetrics                             Anesthesia Physical Anesthesia Plan  ASA: 2  Anesthesia Plan: General   Post-op Pain Management:    Induction: Intravenous and Rapid sequence  PONV Risk Score and Plan: 3 and Ondansetron, Dexamethasone, Midazolam and Treatment may vary due to age or medical condition  Airway Management Planned: Oral ETT and Video Laryngoscope Planned  Additional Equipment: None  Intra-op Plan:   Post-operative Plan: Extubation in OR  Informed Consent: I have reviewed the patients History and Physical, chart, labs and discussed the procedure including the risks, benefits and alternatives  for the proposed anesthesia with the patient or authorized representative who has indicated his/her understanding and acceptance.     Dental advisory given  Plan Discussed with: CRNA and Surgeon  Anesthesia Plan Comments: (Discussed risks of anesthesia with patient, including PONV, sore throat, lip/dental/eye damage, aspiration. Rare risks discussed as well, such as cardiorespiratory and neurological sequelae, and allergic reactions. Discussed the role of CRNA in patient's perioperative care. Patient understands.)       Anesthesia Quick Evaluation

## 2023-01-19 ENCOUNTER — Telehealth: Payer: Self-pay | Admitting: Urology

## 2023-01-19 ENCOUNTER — Inpatient Hospital Stay: Payer: 59

## 2023-01-19 DIAGNOSIS — N200 Calculus of kidney: Secondary | ICD-10-CM | POA: Diagnosis not present

## 2023-01-19 LAB — BASIC METABOLIC PANEL
Anion gap: 6 (ref 5–15)
BUN: 22 mg/dL — ABNORMAL HIGH (ref 6–20)
CO2: 25 mmol/L (ref 22–32)
Calcium: 8.4 mg/dL — ABNORMAL LOW (ref 8.9–10.3)
Chloride: 106 mmol/L (ref 98–111)
Creatinine, Ser: 0.84 mg/dL (ref 0.44–1.00)
GFR, Estimated: >60 mL/min (ref >60)
Glucose, Bld: 98 mg/dL (ref 70–99)
Potassium: 3.8 mmol/L (ref 3.5–5.1)
Sodium: 137 mmol/L (ref 135–145)

## 2023-01-19 LAB — CBC
HCT: 34.9 % — ABNORMAL LOW (ref 36.0–46.0)
Hemoglobin: 11.6 g/dL — ABNORMAL LOW (ref 12.0–15.0)
MCH: 29.9 pg (ref 26.0–34.0)
MCHC: 33.2 g/dL (ref 30.0–36.0)
MCV: 89.9 fL (ref 80.0–100.0)
Platelets: 254 10*3/uL (ref 150–400)
RBC: 3.88 MIL/uL (ref 3.87–5.11)
RDW: 12.4 % (ref 11.5–15.5)
WBC: 10 10*3/uL (ref 4.0–10.5)
nRBC: 0 % (ref 0.0–0.2)

## 2023-01-19 MED ORDER — CIPROFLOXACIN HCL 500 MG PO TABS
500.0000 mg | ORAL_TABLET | Freq: Two times a day (BID) | ORAL | Status: DC
  Administered 2023-01-19 – 2023-01-20 (×3): 500 mg via ORAL
  Filled 2023-01-19 (×3): qty 1

## 2023-01-19 MED ORDER — OXYCODONE HCL 5 MG PO TABS
5.0000 mg | ORAL_TABLET | ORAL | Status: DC | PRN
  Administered 2023-01-19 – 2023-01-20 (×3): 10 mg via ORAL
  Filled 2023-01-19 (×3): qty 2

## 2023-01-19 MED ORDER — SENNOSIDES-DOCUSATE SODIUM 8.6-50 MG PO TABS
1.0000 | ORAL_TABLET | Freq: Two times a day (BID) | ORAL | Status: DC
  Administered 2023-01-19 – 2023-01-20 (×3): 1 via ORAL
  Filled 2023-01-19 (×3): qty 1

## 2023-01-19 MED ORDER — BISACODYL 5 MG PO TBEC
5.0000 mg | DELAYED_RELEASE_TABLET | Freq: Every day | ORAL | Status: DC | PRN
  Administered 2023-01-19: 5 mg via ORAL
  Filled 2023-01-19 (×2): qty 1

## 2023-01-19 MED ORDER — BISACODYL 10 MG RE SUPP
10.0000 mg | Freq: Every day | RECTAL | Status: DC | PRN
  Administered 2023-01-19: 10 mg via RECTAL
  Filled 2023-01-19: qty 1

## 2023-01-19 MED ORDER — FLEET ENEMA RE ENEM: 1.0000 | ENEMA | Freq: Every day | RECTAL | Status: DC | PRN

## 2023-01-19 MED ORDER — POLYETHYLENE GLYCOL 3350 17 G PO PACK
17.0000 g | PACK | Freq: Every day | ORAL | Status: DC
  Administered 2023-01-19 – 2023-01-20 (×2): 17 g via ORAL
  Filled 2023-01-19 (×2): qty 1

## 2023-01-19 NOTE — Progress Notes (Signed)
Progress Note   Patient: Sherri Byrd ZOX:[id number] DOB: [birth year] DOA: 01/17/2023     2 DOS: the patient was seen and examined on 01/19/2023   Brief hospital course: Chief Concern: Right flank pain   HPI from admission:  "Sherri Byrd is a 62 year old female with history of morbid obesity, non-insulin-dependent diabetes mellitus, osteoarthritis, who presents emergency department for chief concerns of right flank pain, hematuria, nausea.   Vitals in the ED showed temperature of 98.6, respiration rate 18, heart rate of 98, blood pressure 158/76, SpO2 of 99% on room air.   Serum sodium is 137, potassium 3.7, chloride 105, bicarb 21, BUN of 14, serum creatinine 0.71, EGFR greater than 60, nonfasting blood glucose 106, WBC 14.2, platelets of 265, hemoglobin 12.7.   UA was positive for large leukocytes and negative for nitrates.  Positive for few bacteria.   ED treatment: Toradol 15 mg IV one-time dose, ondansetron 4 mg IV. ------------------------------ At bedside, patient is able to tell me her name, age, current location, current year.   She reports right flank pain that started yesterday. She denies trauma to her person. She reports the pain got worse with laying down and blood in urine last evening. She endorses vomiting twice (last evening and today). She vomited up yellow bile both times.   She endorses chills and subjective fever last evening. She did not check her temperature. ..."    Further hospital course and management as outlined below.   Assessment and Plan:  Obstructive nephrolithiasis Right UPJ calculus with severe hydronephrosis UTI Patient is status post cystoscopy with right ureteral stent placement Continue empiric antibiotic therapy with ciprofloxacin for pyuria suggestive of a possible infection Pain control - adjusted oxycodone 5-10 mg q4hPRN, IV Toradol PRN Bowel regimen for constipation    Morbid obesity BMI 39 Complicates overall prognosis and  care Lifestyle education and exercise has been discussed with patient in detail         Subjective: Patient awake resting in bed this AM.  She reports ongoing flank >> groin pain poorly controlled with 5 mg oxycodone. IV toradol helps for couple of hours.  Hopes to go home soon but concerned about uncontrolled pain.  Has intermittent hematuria. No fever/chills.    Physical Exam: Vitals:   01/18/23 1933 01/18/23 1934 01/19/23 0307 01/19/23 0841  BP: 136/71  (!) 99/44 110/63  Pulse: 85  79 72  Resp: 18  16 20   Temp:  98.3 F (36.8 C) 97.8 F (36.6 C) 98.3 F (36.8 C)  TempSrc:  Axillary Oral Oral  SpO2: 99%  97% 98%  Weight:      Height:        General exam: awake, alert, no acute distress HEENT: atraumatic, clear conjunctiva, anicteric sclera, moist mucus membranes, hearing grossly normal  Respiratory system: CTA, no wheezes, rales or rhonchi, normal respiratory effort. Cardiovascular system: normal S1/S2, RRR, no pedal edema.   Gastrointestinal system: soft, NT, ND, no HSM felt, +bowel sounds. Central nervous system: A&O x 3. no gross focal neurologic deficits, normal speech Extremities: moves all, no edema, normal tone Skin: dry, intact, normal temperature, normal color, No rashes, lesions or ulcers Psychiatry: normal mood, congruent affect, judgement and insight appear normal    Data Reviewed: Labs reviewed. WBC 14.2 >> 7.3 >> 10.0 Hbg stable 11.6   Family Communication: Plan of care discussed with patient  Disposition: Status is: Inpatient Remains inpatient appropriate because: Pain control, IV antibiotics   Planned Discharge Destination: Home  Time spent: 35 minutes  Author: Pennie Banter, DO 01/19/2023 2:03 PM  For on call review [email or web address].

## 2023-01-19 NOTE — Progress Notes (Signed)
Urology Consult Follow Up  Subjective: S/P right ureteral stent placement 01/18/2023  She is having some mild suprapubic discomfort.  Soft BP, afebrile.  No am labs available.  She is not having issues with voiding.  Voiding dark colored urine.     Anti-infectives: Anti-infectives (From admission, onward)    Start     Dose/Rate Route Frequency Ordered Stop   01/18/23 0300  ciprofloxacin (CIPRO) IVPB 400 mg        400 mg 200 mL/hr over 60 Minutes Intravenous Every 12 hours 01/17/23 1450 01/24/23 0259   01/17/23 1430  ciprofloxacin (CIPRO) IVPB 400 mg        400 mg 200 mL/hr over 60 Minutes Intravenous  Once 01/17/23 1429 01/17/23 1631       Current Facility-Administered Medications  Medication Dose Route Frequency Provider Last Rate Last Admin   acetaminophen (TYLENOL) tablet 650 mg  650 mg Oral Q6H PRN Riki Altes, MD   650 mg at 01/19/23 5188   Or   acetaminophen (TYLENOL) suppository 650 mg  650 mg Rectal Q6H PRN Stoioff, Scott C, MD       ciprofloxacin (CIPRO) IVPB 400 mg  400 mg Intravenous Q12H Stoioff, Verna Czech, MD   Stopped at 01/19/23 0302   diclofenac Sodium (VOLTAREN) 1 % topical gel 2 g  2 g Topical QID PRN Stoioff, Scott C, MD       enoxaparin (LOVENOX) injection 40 mg  40 mg Subcutaneous Q24H Agbata, Tochukwu, MD       ketorolac (TORADOL) 30 MG/ML injection 30 mg  30 mg Intravenous Q6H PRN Andris Baumann, MD   30 mg at 01/19/23 0157   ondansetron (ZOFRAN) tablet 4 mg  4 mg Oral Q6H PRN Riki Altes, MD       Or   ondansetron (ZOFRAN) injection 4 mg  4 mg Intravenous Q6H PRN Stoioff, Scott C, MD   4 mg at 01/17/23 2235   oxybutynin (DITROPAN) tablet 5 mg  5 mg Oral Q8H PRN Stoioff, Scott C, MD       oxyCODONE (Oxy IR/ROXICODONE) immediate release tablet 5 mg  5 mg Oral Q4H PRN Agbata, Tochukwu, MD   5 mg at 01/19/23 0650   senna-docusate (Senokot-S) tablet 1 tablet  1 tablet Oral QHS PRN Riki Altes, MD   1 tablet at 01/18/23 1959      Objective: Vital signs in last 24 hours: Temp:  [97.3 F (36.3 C)-98.4 F (36.9 C)] 97.8 F (36.6 C) (10/30 0307) Pulse Rate:  [72-102] 79 (10/30 0307) Resp:  [14-20] 16 (10/30 0307) BP: (99-141)/(44-73) 99/44 (10/30 0307) SpO2:  [96 %-99 %] 97 % (10/30 0307) Weight:  [105.2 kg] 105.2 kg (10/29 0812)  Intake/Output from previous day: 10/29 0701 - 10/30 0700 In: 400 [I.V.:400] Out: 2 [Urine:1; Blood:1] Intake/Output this shift: No intake/output data recorded.   Physical Exam  Lab Results:  Recent Labs    01/17/23 1052 01/18/23 0425  WBC 14.2* 7.3  HGB 12.7 11.1*  HCT 37.8 33.7*  PLT 265 210   BMET Recent Labs    01/17/23 1052 01/18/23 0425  NA 137 136  K 3.7 3.5  CL 105 105  CO2 21* 25  GLUCOSE 106* 120*  BUN 14 18  CREATININE 0.71 0.75  CALCIUM 8.5* 7.9*   PT/INR No results for input(s): "LABPROT", "INR" in the last 72 hours. ABG No results for input(s): "PHART", "HCO3" in the last 72 hours.  Invalid input(s): "PCO2", "PO2"  Studies/Results: DG OR UROLOGY CYSTO IMAGE ([facility] ONLY)  Result Date: 01/18/2023 There is no interpretation for this exam.  This order is for images obtained during a surgical procedure.  Please See "Surgeries" Tab for more information regarding the procedure.   CT Renal Stone Study  Result Date: 01/17/2023 CLINICAL DATA:  Right flank and right lower quadrant pain. EXAM: CT ABDOMEN AND PELVIS WITHOUT CONTRAST TECHNIQUE: Multidetector CT imaging of the abdomen and pelvis was performed following the standard protocol without IV contrast. RADIATION DOSE REDUCTION: This exam was performed according to the departmental dose-optimization program which includes automated exposure control, adjustment of the mA and/or kV according to patient size and/or use of iterative reconstruction technique. COMPARISON:  CT abdomen/pelvis dated December 01, 2007. Renal ultrasound dated October 02, 2021. FINDINGS: Lower chest: No acute abnormality.  Hepatobiliary: No suspicious focal liver lesion, within the limits of an unenhanced exam. No gallstones, gallbladder wall thickening, or biliary dilatation. Pancreas: Unremarkable. No pancreatic ductal dilatation or surrounding inflammatory changes. Spleen: Normal in size without focal abnormality. Adrenals/Urinary Tract: The adrenal glands are unremarkable. There is a 1.6 cm obstructive calculus at the right ureteropelvic junction with severe upstream hydronephrosis. The distal right ureter tapers to normal caliber. No left-sided renal calculi or hydronephrosis. Renal cortical scarring at the interpolar left kidney. The bladder is unremarkable. Stomach/Bowel: Stomach is within normal limits. Appendix appears normal. No evidence of bowel wall thickening, distention, or inflammatory changes. Sigmoid colonic diverticulosis without evidence of acute diverticulitis. Vascular/Lymphatic: Aortic atherosclerosis. No enlarged abdominal or pelvic lymph nodes. Reproductive: Uterus and bilateral adnexa are unremarkable. Other: No abdominopelvic ascites. Musculoskeletal: No suspicious osseous lesion. No acute osseous abnormality. IMPRESSION: 1. 1.6 cm obstructive calculus at the right ureteropelvic junction with severe upstream hydronephrosis. 2. Sigmoid colonic diverticulosis without evidence of acute diverticulitis. Electronically Signed   By: Hart Robinsons M.D.   On: 01/17/2023 14:12     Assessment: 62 year old female with a history of nephrolithiasis who underwent right emergent ureteral stent placement for a 1.6 cm obstructive calculus at the right UPJ in the setting of infection with Dr. Lonna Cobb  -morning labs still pending  -culture from aspirated fluid from placement of ureteral stent still pending   Plan: -Obtain BMP and CBC this a.m. -Consider changing to Cipro p.o  -contine to follow cultures  -will need 10 days of culture appropriate antibiotics  -She is still considering her options going forward  whether she would want to pursue ureteroscopy versus ESWL, so we will schedule an outpatient appointment with her to discuss this further upon discharge     LOS: 2 days    [facility] 01/19/2023

## 2023-01-19 NOTE — Plan of Care (Signed)
  Problem: Education: Goal: Knowledge of General Education information will improve Description: Including pain rating scale, medication(s)/side effects and non-pharmacologic comfort measures Outcome: Progressing   Problem: Nutrition: Goal: Adequate nutrition will be maintained Outcome: Progressing   Problem: Coping: Goal: Level of anxiety will decrease Outcome: Progressing   Problem: Elimination: Goal: Will not experience complications related to bowel motility Outcome: Progressing Goal: Will not experience complications related to urinary retention Outcome: Progressing   Problem: Pain Management: Goal: General experience of comfort will improve Outcome: Progressing   Problem: Safety: Goal: Ability to remain free from injury will improve Outcome: Progressing

## 2023-01-19 NOTE — Plan of Care (Signed)
Patient not passing gas nor has had BM.  Xray completed awaiting results.  Patient ambulating. Abd distended and having pain an d nausea.   Problem: Education: Goal: Knowledge of General Education information will improve Description: Including pain rating scale, medication(s)/side effects and non-pharmacologic comfort measures Outcome: Progressing   Problem: Health Behavior/Discharge Planning: Goal: Ability to manage health-related needs will improve Outcome: Progressing   Problem: Clinical Measurements: Goal: Ability to maintain clinical measurements within normal limits will improve Outcome: Progressing Goal: Will remain free from infection Outcome: Progressing Goal: Diagnostic test results will improve Outcome: Progressing Goal: Respiratory complications will improve Outcome: Progressing Goal: Cardiovascular complication will be avoided Outcome: Progressing   Problem: Activity: Goal: Risk for activity intolerance will decrease Outcome: Progressing   Problem: Nutrition: Goal: Adequate nutrition will be maintained Outcome: Progressing   Problem: Coping: Goal: Level of anxiety will decrease Outcome: Progressing   Problem: Elimination: Goal: Will not experience complications related to bowel motility Outcome: Progressing Goal: Will not experience complications related to urinary retention Outcome: Progressing   Problem: Pain Management: Goal: General experience of comfort will improve Outcome: Progressing   Problem: Safety: Goal: Ability to remain free from injury will improve Outcome: Progressing   Problem: Skin Integrity: Goal: Risk for impaired skin integrity will decrease Outcome: Progressing

## 2023-01-19 NOTE — Telephone Encounter (Signed)
She is currently in the hospital, but she will need a follow up appointment with either me, Sam or Dr. Lonna Cobb to discuss stone treatment when she is discharged.

## 2023-01-20 DIAGNOSIS — N200 Calculus of kidney: Secondary | ICD-10-CM | POA: Diagnosis not present

## 2023-01-20 MED ORDER — POLYETHYLENE GLYCOL 3350 17 G PO PACK: 17.0000 g | PACK | Freq: Every day | ORAL | Status: DC

## 2023-01-20 MED ORDER — CIPROFLOXACIN HCL 500 MG PO TABS: 500.0000 mg | ORAL_TABLET | Freq: Two times a day (BID) | ORAL | 0 refills | Status: DC

## 2023-01-20 MED ORDER — ACETAMINOPHEN 325 MG PO TABS: 650.0000 mg | ORAL_TABLET | Freq: Four times a day (QID) | ORAL | Status: AC | PRN

## 2023-01-20 MED ORDER — OXYBUTYNIN CHLORIDE 5 MG PO TABS: 5.0000 mg | ORAL_TABLET | Freq: Three times a day (TID) | ORAL | 0 refills | Status: DC | PRN

## 2023-01-20 MED ORDER — SENNOSIDES-DOCUSATE SODIUM 8.6-50 MG PO TABS: 1.0000 | ORAL_TABLET | Freq: Two times a day (BID) | ORAL | Status: DC

## 2023-01-20 MED ORDER — OXYCODONE HCL 5 MG PO TABS: 5.0000 mg | ORAL_TABLET | ORAL | 0 refills | Status: DC | PRN

## 2023-01-20 MED ORDER — ONDANSETRON HCL 4 MG PO TABS: 4.0000 mg | ORAL_TABLET | Freq: Four times a day (QID) | ORAL | 0 refills | Status: DC | PRN

## 2023-01-20 NOTE — Progress Notes (Signed)
Urology Consult Follow Up  Subjective: Patient had some pain last evening, but after she was able to have a small bowel movement the pain has considerably lessened.  VSS afebrile    Anti-infectives: Anti-infectives (From admission, onward)    Start     Dose/Rate Route Frequency Ordered Stop   01/19/23 1300  ciprofloxacin (CIPRO) tablet 500 mg        500 mg Oral 2 times daily 01/19/23 0917 01/24/23 0959   01/18/23 0300  ciprofloxacin (CIPRO) IVPB 400 mg  Status:  Discontinued        400 mg 200 mL/hr over 60 Minutes Intravenous Every 12 hours 01/17/23 1450 01/19/23 0917   01/17/23 1430  ciprofloxacin (CIPRO) IVPB 400 mg        400 mg 200 mL/hr over 60 Minutes Intravenous  Once 01/17/23 1429 01/17/23 1631       Current Facility-Administered Medications  Medication Dose Route Frequency Provider Last Rate Last Admin   acetaminophen (TYLENOL) tablet 650 mg  650 mg Oral Q6H PRN Riki Altes, MD   650 mg at 01/19/23 1610   Or   acetaminophen (TYLENOL) suppository 650 mg  650 mg Rectal Q6H PRN Stoioff, Verna Czech, MD       bisacodyl (DULCOLAX) suppository 10 mg  10 mg Rectal Daily PRN Esaw Grandchild A, DO   10 mg at 01/19/23 1801   ciprofloxacin (CIPRO) tablet 500 mg  500 mg Oral BID Esaw Grandchild A, DO   500 mg at 01/19/23 2118   diclofenac Sodium (VOLTAREN) 1 % topical gel 2 g  2 g Topical QID PRN Stoioff, Scott C, MD       enoxaparin (LOVENOX) injection 40 mg  40 mg Subcutaneous Q24H Agbata, Tochukwu, MD   40 mg at 01/19/23 0851   ketorolac (TORADOL) 30 MG/ML injection 30 mg  30 mg Intravenous Q6H PRN Andris Baumann, MD   30 mg at 01/19/23 0853   ondansetron (ZOFRAN) tablet 4 mg  4 mg Oral Q6H PRN Riki Altes, MD       Or   ondansetron (ZOFRAN) injection 4 mg  4 mg Intravenous Q6H PRN Stoioff, Scott C, MD   4 mg at 01/19/23 2132   oxybutynin (DITROPAN) tablet 5 mg  5 mg Oral Q8H PRN Stoioff, Scott C, MD       oxyCODONE (Oxy IR/ROXICODONE) immediate release tablet 5-10 mg   5-10 mg Oral Q4H PRN Esaw Grandchild A, DO   10 mg at 01/19/23 2253   polyethylene glycol (MIRALAX / GLYCOLAX) packet 17 g  17 g Oral Daily Esaw Grandchild A, DO   17 g at 01/19/23 1105   senna-docusate (Senokot-S) tablet 1 tablet  1 tablet Oral BID Esaw Grandchild A, DO   1 tablet at 01/19/23 2118   sodium phosphate (FLEET) enema 1 enema  1 enema Rectal Daily PRN Esaw Grandchild A, DO         Objective: Vital signs in last 24 hours: Temp:  [97.8 F (36.6 C)-98.3 F (36.8 C)] 98 F (36.7 C) (10/31 0730) Pulse Rate:  [66-72] 70 (10/31 0730) Resp:  [18-20] 18 (10/31 0730) BP: (109-129)/(63-92) 109/92 (10/31 0730) SpO2:  [97 %-100 %] 97 % (10/31 0730)  Intake/Output from previous day: 10/30 0701 - 10/31 0700 In: 480 [P.O.:480] Out: 1 [Stool:1] Intake/Output this shift: No intake/output data recorded.   Physical Exam Vitals and nursing note reviewed.  Constitutional:      General: She is not in acute distress.  Appearance: Normal appearance. She is not ill-appearing, toxic-appearing or diaphoretic.  HENT:     Head: Normocephalic and atraumatic.     Nose: Nose normal.     Mouth/Throat:     Mouth: Mucous membranes are moist.  Eyes:     Extraocular Movements: Extraocular movements intact.     Conjunctiva/sclera: Conjunctivae normal.     Pupils: Pupils are equal, round, and reactive to light.  Pulmonary:     Effort: Pulmonary effort is normal.  Abdominal:     Palpations: Abdomen is soft.     Comments: Slight tenderness in suprapubic region  Musculoskeletal:        General: Normal range of motion.     Cervical back: Normal range of motion.  Skin:    General: Skin is warm and dry.  Neurological:     General: No focal deficit present.     Mental Status: She is alert and oriented to person, place, and time.  Psychiatric:        Mood and Affect: Mood normal.        Behavior: Behavior normal.        Thought Content: Thought content normal.        Judgment: Judgment  normal.     Lab Results:  Recent Labs    01/18/23 0425 01/19/23 0911  WBC 7.3 10.0  HGB 11.1* 11.6*  HCT 33.7* 34.9*  PLT 210 254   BMET Recent Labs    01/18/23 0425 01/19/23 0911  NA 136 137  K 3.5 3.8  CL 105 106  CO2 25 25  GLUCOSE 120* 98  BUN 18 22*  CREATININE 0.75 0.84  CALCIUM 7.9* 8.4*   PT/INR No results for input(s): "LABPROT", "INR" in the last 72 hours. ABG No results for input(s): "PHART", "HCO3" in the last 72 hours.  Invalid input(s): "PCO2", "PO2"  Studies/Results: DG Abd 1 View  Result Date: 01/19/2023 CLINICAL DATA:  14782. Abdominal pain and constipation. Follow-up for right UPJ stone post stent placement. EXAM: ABDOMEN - 1 VIEW COMPARISON:  CT without contrast 01/17/2023. FINDINGS: The bowel gas pattern is nonobstructive with mild-to-moderate fecal retention. Right ureteral stenting has been performed in the interval with the upper loop at the level of the renal pelvis, lower loop in the bladder right of the midline. The upper loop of the stent is just above the again noted irregular 2.2 x 1.1 cm right UPJ stone, level of the right L2 distal transverse process. The CT demonstrated several scattered punctate nonobstructing caliceal stones in both kidneys but they are not visible radiographically. Left pelvic phleboliths are again shown. There is no supine evidence for free air or other significant radiographic findings. IMPRESSION: 1. Interval right ureteral stenting with the upper loop at the level of the renal pelvis, lower loop in the bladder right of the midline. The upper loop of the stent is just above the again noted irregular 2.2 x 1.1 cm right UPJ stone, level of the right L2 distal transverse process. 2. Mild-to-moderate fecal retention. 3. Bilateral nonobstructive micronephrolithiasis seen on CT is not visible radiographically. Electronically Signed   By: Almira Bar M.D.   On: 01/19/2023 20:59   DG OR UROLOGY CYSTO IMAGE ([facility]  ONLY)  Result Date: 01/18/2023 There is no interpretation for this exam.  This order is for images obtained during a surgical procedure.  Please See "Surgeries" Tab for more information regarding the procedure.     Assessment: 62 year old female with a history of nephrolithiasis who  underwent right emergent ureteral stent placement on 01/18/2023 for a 1.6 cm obstructive calculus at the right UPJ in the setting of infection with Dr. Lonna Cobb   -Urine culture with insignificant growth  -Cultures from aspiration during ureteroscopy, preliminary no growth  Plan: -Will need 10 days of culture appropriate antibiotics, continue Cipro p.o. -Will schedule outpatient appointment to discuss further her choices for definitive stone management     LOS: 3 days    [facility] 01/20/2023

## 2023-01-20 NOTE — Plan of Care (Signed)
Adequate for discharge.

## 2023-01-21 ENCOUNTER — Telehealth: Payer: Self-pay

## 2023-01-21 NOTE — Telephone Encounter (Signed)
Sherri Byrd left message on triage line, she had procedure with Dr Lonna Cobb on 10/30 and was released from hospital yesterday 10/31 and at that time before leaving saw her eye had a blood vessel that burst, it was red, this is what she was told in the hospital. She wants to know what caused this and what she needs to do at this time.

## 2023-01-22 LAB — BODY FLUID CULTURE W GRAM STAIN

## 2023-01-25 ENCOUNTER — Encounter: Payer: Self-pay | Admitting: *Deleted

## 2023-01-25 NOTE — Telephone Encounter (Signed)
The eye is not my area of expertise so do not know the cause.  If she is having any vision problems she needs to see an ophthalmologist.  She has an appointment with Carollee Herter 01/27/2023.

## 2023-01-25 NOTE — Telephone Encounter (Signed)
Sent my chart message , tried to call .

## 2023-01-26 NOTE — H&P (View-Only) (Signed)
01/27/2023 3:08 PM   Sherri Byrd 06-07-1960 [id number]  Referring provider: Corky Downs, MD [address] [place],  [place] [address]  Urological history: 1.  Nephrolithiasis -Previous patient of Dr. Wynn Maudlin -She has had previous ESWL and PCNL by Dr. Achilles Dunk greater than 10 years ago  Chief Complaint  Patient presents with   Hematuria   Follow-up    HPI: Sherri Byrd is a 62 y.o. female who presents today for discussion regarding her stones.    Previous records reviewed.   She was admitted last month for sepsis caused by an obstructing right UPJ stone and underwent emergent stent placement during that admission.  She finished her Cipro yesterday.  She is having intense lower suprapubic pain.  She is taking the oxycodone IR 5 mg 2 tablets 4 times a day.  She does not see any stent material from her urethra.  Patient denies any modifying or aggravating factors.  Patient denies any recent UTI's, gross hematuria, dysuria or suprapubic/flank pain.  Patient denies any fevers, chills, nausea or vomiting.    UA grossly infected    PMH: Past Medical History:  Diagnosis Date   Kidney stone     Surgical History: Past Surgical History:  Procedure Laterality Date   CYSTOSCOPY WITH STENT PLACEMENT Right 01/18/2023   Procedure: CYSTOSCOPY WITH RIGHT URETERAL STENT PLACEMENT;  Surgeon: Riki Altes, MD;  Location: [facility] ORS;  Service: Urology;  Laterality: Right;   kidney stone remove     kidney stone stent Left     Home Medications:  Allergies as of 01/27/2023       Reactions   Prednisone Shortness Of Breath   tachycardia   Meloxicam Other (See Comments)   LE edema   Penicillin G Hives   Sulfa Antibiotics Hives        Medication List        Accurate as of January 27, 2023  3:08 PM. If you have any questions, ask your nurse or doctor.          acetaminophen 325 MG tablet Commonly known as: TYLENOL Take 2 tablets (650 mg total) by mouth every 6 (six) hours  as needed for fever, headache or mild pain (pain score 1-3).   ciprofloxacin 500 MG tablet Commonly known as: CIPRO Take 1 tablet (500 mg total) by mouth 2 (two) times daily for 6 days.   ibuprofen 800 MG tablet Commonly known as: ADVIL Take 1 tablet (800 mg total) by mouth 3 (three) times daily.   ondansetron 4 MG tablet Commonly known as: ZOFRAN Take 1 tablet (4 mg total) by mouth every 6 (six) hours as needed for nausea.   oxybutynin 5 MG tablet Commonly known as: DITROPAN Take 1 tablet (5 mg total) by mouth every 8 (eight) hours as needed for bladder spasms (Frequency, urgency, bladder spasm).   oxyCODONE 5 MG immediate release tablet Commonly known as: Oxy IR/ROXICODONE Take 1-2 tablets (5-10 mg total) by mouth every 4 (four) hours as needed for moderate pain (pain score 4-6).   polyethylene glycol 17 g packet Commonly known as: MIRALAX / GLYCOLAX Take 17 g by mouth daily.   senna-docusate 8.6-50 MG tablet Commonly known as: Senokot-S Take 1 tablet by mouth 2 (two) times daily.        Allergies:  Allergies  Allergen Reactions   Prednisone Shortness Of Breath    tachycardia   Meloxicam Other (See Comments)    LE edema   Penicillin G Hives  Sulfa Antibiotics Hives    Family History: Family History  Problem Relation Age of Onset   Breast cancer Maternal Aunt 74   Breast cancer Cousin 53       maternal    Social History:  reports that she has never smoked. She has never used smokeless tobacco. She reports that she does not currently use alcohol. She reports that she does not use drugs.  ROS: Pertinent ROS in HPI  Physical Exam: BP 127/80   Pulse 85   Ht 5\' 4"  (1.626 m)   Wt 232 lb (105.2 kg)   BMI 39.82 kg/m   Constitutional:  Well nourished. Alert and oriented, No acute distress. HEENT: [place] AT, moist mucus membranes.  Trachea midline Cardiovascular: No clubbing, cyanosis, or edema. Respiratory: Normal respiratory effort, no increased work of  breathing. Neurologic: Grossly intact, no focal deficits, moving all 4 extremities. Psychiatric: Normal mood and affect.    Laboratory Data: Lab Results  Component Value Date   WBC 10.0 01/19/2023   HGB 11.6 (L) 01/19/2023   HCT 34.9 (L) 01/19/2023   MCV 89.9 01/19/2023   PLT 254 01/19/2023    Lab Results  Component Value Date   CREATININE 0.84 01/19/2023    Lab Results  Component Value Date   AST 19 01/17/2023   Lab Results  Component Value Date   ALT 18 01/17/2023    Urinalysis See epic and HPI I have reviewed the labs.   Pertinent Imaging: CLINICAL DATA:  Right flank and right lower quadrant pain.   EXAM: CT ABDOMEN AND PELVIS WITHOUT CONTRAST   TECHNIQUE: Multidetector CT imaging of the abdomen and pelvis was performed following the standard protocol without IV contrast.   RADIATION DOSE REDUCTION: This exam was performed according to the departmental dose-optimization program which includes automated exposure control, adjustment of the mA and/or kV according to patient size and/or use of iterative reconstruction technique.   COMPARISON:  CT abdomen/pelvis dated December 01, 2007. Renal ultrasound dated October 02, 2021.   FINDINGS: Lower chest: No acute abnormality.   Hepatobiliary: No suspicious focal liver lesion, within the limits of an unenhanced exam. No gallstones, gallbladder wall thickening, or biliary dilatation.   Pancreas: Unremarkable. No pancreatic ductal dilatation or surrounding inflammatory changes.   Spleen: Normal in size without focal abnormality.   Adrenals/Urinary Tract: The adrenal glands are unremarkable. There is a 1.6 cm obstructive calculus at the right ureteropelvic junction with severe upstream hydronephrosis. The distal right ureter tapers to normal caliber. No left-sided renal calculi or hydronephrosis. Renal cortical scarring at the interpolar left kidney. The bladder is unremarkable.   Stomach/Bowel: Stomach is  within normal limits. Appendix appears normal. No evidence of bowel wall thickening, distention, or inflammatory changes. Sigmoid colonic diverticulosis without evidence of acute diverticulitis.   Vascular/Lymphatic: Aortic atherosclerosis. No enlarged abdominal or pelvic lymph nodes.   Reproductive: Uterus and bilateral adnexa are unremarkable.   Other: No abdominopelvic ascites.   Musculoskeletal: No suspicious osseous lesion. No acute osseous abnormality.   IMPRESSION: 1. 1.6 cm obstructive calculus at the right ureteropelvic junction with severe upstream hydronephrosis. 2. Sigmoid colonic diverticulosis without evidence of acute diverticulitis.     Electronically Signed   By: Hart Robinsons M.D.   On: 01/17/2023 14:12  CLINICAL DATA:  [address]109. Abdominal pain and constipation. Follow-up for right UPJ stone post stent placement.   EXAM: ABDOMEN - 1 VIEW   COMPARISON:  CT without contrast 01/17/2023.   FINDINGS: The bowel gas pattern is nonobstructive  with mild-to-moderate fecal retention.   Right ureteral stenting has been performed in the interval with the upper loop at the level of the renal pelvis, lower loop in the bladder right of the midline.   The upper loop of the stent is just above the again noted irregular 2.2 x 1.1 cm right UPJ stone, level of the right L2 distal transverse process.   The CT demonstrated several scattered punctate nonobstructing caliceal stones in both kidneys but they are not visible radiographically.   Left pelvic phleboliths are again shown. There is no supine evidence for free air or other significant radiographic findings.   IMPRESSION: 1. Interval right ureteral stenting with the upper loop at the level of the renal pelvis, lower loop in the bladder right of the midline. The upper loop of the stent is just above the again noted irregular 2.2 x 1.1 cm right UPJ stone, level of the right L2 distal transverse process. 2.  Mild-to-moderate fecal retention. 3. Bilateral nonobstructive micronephrolithiasis seen on CT is not visible radiographically.     Electronically Signed   By: Almira Bar M.D.   On: 01/19/2023 20:59 I have independently reviewed the films.   Skin to stone distance greater than 15 cm, stone density less than 1500 Hounsfield units  Assessment & Plan:    1. Right UPJ stone -We discussed various treatment options for urolithiasis including observation with or without medical expulsive therapy, shockwave lithotripsy (SWL), ureteroscopy and laser lithotripsy with stent placement. -We discussed that management is based on stone size, location, density, patient co-morbidities, and patient preference.  -Stones <75mm in size have a >80% spontaneous passage rate. Data surrounding the use of tamsulosin for medical expulsive therapy is controversial, but meta analyses suggests it is most efficacious for distal stones between 5-65mm in size. Possible side effects include dizziness/lightheadedness -SWL has a lower stone free rate in a single procedure, but also a lower complication rate compared to ureteroscopy and avoids a stent and associated stent related symptoms. Possible complications include renal hematoma, steinstrasse, and need for additional treatment. -Ureteroscopy with laser lithotripsy and stent placement has a higher stone free rate than SWL in a single procedure, however increased complication rate including possible infection, ureteral injury, bleeding, and stent related morbidity. Common stent related symptoms include dysuria, urgency/frequency, and flank pain. -She would like to have ureteroscopy as it has a higher success rate.  I did explain that her current stent will be exchanged for a new stent and she still may have the stent discomfort, but we will try her best to keep her comfortable while she heals -I refilled her Cipro, her oxycodone, oxybutynin, Zofran and gave her samples of  Gemtesa 75 mg  2. Gross hematuria  -secondary to stone -UA grossly infected -urine culture pending -continue Cipro     Return for Right URS/LL/ureteral stent placement/exchange.  These notes generated with voice recognition software. I apologize for typographical errors.  Cloretta Ned  [facility] [address]  [address] [place], [place] [address] [phone]

## 2023-01-26 NOTE — Progress Notes (Unsigned)
01/27/2023 8:23 AM   Sherri Byrd [birth year] [id number]  Referring provider: Corky Downs, MD [address] [place],  [place] [address]  Urological history: 1.  Nephrolithiasis -Previous patient of Dr. Wynn Maudlin -She has had previous ESWL and PCNL by Dr. Achilles Dunk greater than 10 years ago  No chief complaint on file.   HPI: Sherri Byrd is a 62 y.o. female who presents today for discussion regarding her stones.    Previous records reviewed.   She was admitted last month for sepsis caused by an obstructing right UPJ stone and underwent emergent stent placement during that admission.  UA ***  KUB ***    PMH: Past Medical History:  Diagnosis Date   Kidney stone     Surgical History: Past Surgical History:  Procedure Laterality Date   CYSTOSCOPY WITH STENT PLACEMENT Right 01/18/2023   Procedure: CYSTOSCOPY WITH RIGHT URETERAL STENT PLACEMENT;  Surgeon: Riki Altes, MD;  Location: [facility] ORS;  Service: Urology;  Laterality: Right;   kidney stone remove     kidney stone stent Left     Home Medications:  Allergies as of 01/27/2023       Reactions   Prednisone Shortness Of Breath   tachycardia   Meloxicam Other (See Comments)   LE edema   Penicillin G Hives   Sulfa Antibiotics Hives        Medication List        Accurate as of January 26, 2023  8:23 AM. If you have any questions, ask your nurse or doctor.          acetaminophen 325 MG tablet Commonly known as: TYLENOL Take 2 tablets (650 mg total) by mouth every 6 (six) hours as needed for fever, headache or mild pain (pain score 1-3).   ibuprofen 800 MG tablet Commonly known as: ADVIL Take 1 tablet (800 mg total) by mouth 3 (three) times daily.   ondansetron 4 MG tablet Commonly known as: ZOFRAN Take 1 tablet (4 mg total) by mouth every 6 (six) hours as needed for nausea.   oxybutynin 5 MG tablet Commonly known as: DITROPAN Take 1 tablet (5 mg total) by mouth every 8 (eight) hours as needed  for bladder spasms (Frequency, urgency, bladder spasm).   oxyCODONE 5 MG immediate release tablet Commonly known as: Oxy IR/ROXICODONE Take 1-2 tablets (5-10 mg total) by mouth every 4 (four) hours as needed for moderate pain (pain score 4-6).   polyethylene glycol 17 g packet Commonly known as: MIRALAX / GLYCOLAX Take 17 g by mouth daily.   senna-docusate 8.6-50 MG tablet Commonly known as: Senokot-S Take 1 tablet by mouth 2 (two) times daily.        Allergies:  Allergies  Allergen Reactions   Prednisone Shortness Of Breath    tachycardia   Meloxicam Other (See Comments)    LE edema   Penicillin G Hives   Sulfa Antibiotics Hives    Family History: Family History  Problem Relation Age of Onset   Breast cancer Maternal Aunt 44   Breast cancer Cousin 37       maternal    Social History:  reports that she has never smoked. She has never used smokeless tobacco. She reports that she does not currently use alcohol. She reports that she does not use drugs.  ROS: Pertinent ROS in HPI  Physical Exam: There were no vitals taken for this visit.  Constitutional:  Well nourished. Alert and oriented, No acute distress. HEENT: [place]  AT, moist mucus membranes.  Trachea midline, no masses. Cardiovascular: No clubbing, cyanosis, or edema. Respiratory: Normal respiratory effort, no increased work of breathing. GU: No CVA tenderness.  No bladder fullness or masses.  Recession of labia minora, dry, pale vulvar vaginal mucosa and loss of mucosal ridges and folds.  Normal urethral meatus, no lesions, no prolapse, no discharge.   No urethral masses, tenderness and/or tenderness. No bladder fullness, tenderness or masses. *** vagina mucosa, *** estrogen effect, no discharge, no lesions, *** pelvic support, *** cystocele and *** rectocele noted.  No cervical motion tenderness.  Uterus is freely mobile and non-fixed.  No adnexal/parametria masses or tenderness noted.  Anus and perineum are without  rashes or lesions.   ***  Neurologic: Grossly intact, no focal deficits, moving all 4 extremities. Psychiatric: Normal mood and affect.    Laboratory Data: Lab Results  Component Value Date   WBC 10.0 01/19/2023   HGB 11.6 (L) 01/19/2023   HCT 34.9 (L) 01/19/2023   MCV 89.9 01/19/2023   PLT 254 01/19/2023    Lab Results  Component Value Date   CREATININE 0.84 01/19/2023    Lab Results  Component Value Date   AST 19 01/17/2023   Lab Results  Component Value Date   ALT 18 01/17/2023    Urinalysis See epic and HPI I have reviewed the labs.   Pertinent Imaging: CLINICAL DATA:  Right flank and right lower quadrant pain.   EXAM: CT ABDOMEN AND PELVIS WITHOUT CONTRAST   TECHNIQUE: Multidetector CT imaging of the abdomen and pelvis was performed following the standard protocol without IV contrast.   RADIATION DOSE REDUCTION: This exam was performed according to the departmental dose-optimization program which includes automated exposure control, adjustment of the mA and/or kV according to patient size and/or use of iterative reconstruction technique.   COMPARISON:  CT abdomen/pelvis dated December 01, 2007. Renal ultrasound dated October 02, 2021.   FINDINGS: Lower chest: No acute abnormality.   Hepatobiliary: No suspicious focal liver lesion, within the limits of an unenhanced exam. No gallstones, gallbladder wall thickening, or biliary dilatation.   Pancreas: Unremarkable. No pancreatic ductal dilatation or surrounding inflammatory changes.   Spleen: Normal in size without focal abnormality.   Adrenals/Urinary Tract: The adrenal glands are unremarkable. There is a 1.6 cm obstructive calculus at the right ureteropelvic junction with severe upstream hydronephrosis. The distal right ureter tapers to normal caliber. No left-sided renal calculi or hydronephrosis. Renal cortical scarring at the interpolar left kidney. The bladder is unremarkable.    Stomach/Bowel: Stomach is within normal limits. Appendix appears normal. No evidence of bowel wall thickening, distention, or inflammatory changes. Sigmoid colonic diverticulosis without evidence of acute diverticulitis.   Vascular/Lymphatic: Aortic atherosclerosis. No enlarged abdominal or pelvic lymph nodes.   Reproductive: Uterus and bilateral adnexa are unremarkable.   Other: No abdominopelvic ascites.   Musculoskeletal: No suspicious osseous lesion. No acute osseous abnormality.   IMPRESSION: 1. 1.6 cm obstructive calculus at the right ureteropelvic junction with severe upstream hydronephrosis. 2. Sigmoid colonic diverticulosis without evidence of acute diverticulitis.     Electronically Signed   By: Hart Robinsons M.D.   On: 01/17/2023 14:12  CLINICAL DATA:  [address]. Abdominal pain and constipation. Follow-up for right UPJ stone post stent placement.   EXAM: ABDOMEN - 1 VIEW   COMPARISON:  CT without contrast 01/17/2023.   FINDINGS: The bowel gas pattern is nonobstructive with mild-to-moderate fecal retention.   Right ureteral stenting has been performed in the interval  with the upper loop at the level of the renal pelvis, lower loop in the bladder right of the midline.   The upper loop of the stent is just above the again noted irregular 2.2 x 1.1 cm right UPJ stone, level of the right L2 distal transverse process.   The CT demonstrated several scattered punctate nonobstructing caliceal stones in both kidneys but they are not visible radiographically.   Left pelvic phleboliths are again shown. There is no supine evidence for free air or other significant radiographic findings.   IMPRESSION: 1. Interval right ureteral stenting with the upper loop at the level of the renal pelvis, lower loop in the bladder right of the midline. The upper loop of the stent is just above the again noted irregular 2.2 x 1.1 cm right UPJ stone, level of the right L2  distal transverse process. 2. Mild-to-moderate fecal retention. 3. Bilateral nonobstructive micronephrolithiasis seen on CT is not visible radiographically.     Electronically Signed   By: Almira Bar M.D.   On: 01/19/2023 20:59 I have independently reviewed the films.   Skin to stone distance greater than 15 cm, stone density less than 1500 Hounsfield units  Assessment & Plan:  ***  1. Right UPJ stone -KUB ***  We discussed various treatment options for urolithiasis including observation with or without medical expulsive therapy, shockwave lithotripsy (SWL), ureteroscopy and laser lithotripsy with stent placement.   We discussed that management is based on stone size, location, density, patient co-morbidities, and patient preference.    Stones <66mm in size have a >80% spontaneous passage rate. Data surrounding the use of tamsulosin for medical expulsive therapy is controversial, but meta analyses suggests it is most efficacious for distal stones between 5-77mm in size. Possible side effects include dizziness/lightheadedness   SWL has a lower stone free rate in a single procedure, but also a lower complication rate compared to ureteroscopy and avoids a stent and associated stent related symptoms. Possible complications include renal hematoma, steinstrasse, and need for additional treatment.   Ureteroscopy with laser lithotripsy and stent placement has a higher stone free rate than SWL in a single procedure, however increased complication rate including possible infection, ureteral injury, bleeding, and stent related morbidity. Common stent related symptoms include dysuria, urgency/frequency, and flank pain.  2. Gross hematuria  -UA *** -urine culture pending      No follow-ups on file.  These notes generated with voice recognition software. I apologize for typographical errors.  Cloretta Ned  [facility] [address]  [address] [place], [place] [address] [phone]

## 2023-01-27 ENCOUNTER — Ambulatory Visit: Payer: 59 | Admitting: Urology

## 2023-01-27 ENCOUNTER — Telehealth: Payer: Self-pay

## 2023-01-27 ENCOUNTER — Encounter: Payer: Self-pay | Admitting: Urology

## 2023-01-27 ENCOUNTER — Other Ambulatory Visit: Payer: Self-pay

## 2023-01-27 VITALS — BP 127/80 | HR 85 | Ht 64.0 in | Wt 232.0 lb

## 2023-01-27 DIAGNOSIS — N201 Calculus of ureter: Secondary | ICD-10-CM | POA: Diagnosis not present

## 2023-01-27 DIAGNOSIS — R310 Gross hematuria: Secondary | ICD-10-CM

## 2023-01-27 LAB — URINALYSIS, COMPLETE
Bilirubin, UA: NEGATIVE
Glucose, UA: NEGATIVE
Ketones, UA: NEGATIVE
Nitrite, UA: NEGATIVE
Specific Gravity, UA: >1.03 — ABNORMAL HIGH (ref 1.005–1.030)
Urobilinogen, Ur: 0.2 mg/dL (ref 0.2–1.0)
pH, UA: 5.5 (ref 5.0–7.5)

## 2023-01-27 LAB — MICROSCOPIC EXAMINATION: RBC, Urine: >30 /[HPF] — AB (ref 0–2)

## 2023-01-27 MED ORDER — CIPROFLOXACIN HCL 500 MG PO TABS: 500.0000 mg | ORAL_TABLET | Freq: Two times a day (BID) | ORAL | 0 refills | Status: AC

## 2023-01-27 MED ORDER — ONDANSETRON HCL 4 MG PO TABS: 4.0000 mg | ORAL_TABLET | Freq: Four times a day (QID) | ORAL | 0 refills | Status: DC | PRN

## 2023-01-27 MED ORDER — OXYBUTYNIN CHLORIDE 5 MG PO TABS: 5.0000 mg | ORAL_TABLET | Freq: Three times a day (TID) | ORAL | 0 refills | Status: DC | PRN

## 2023-01-27 MED ORDER — OXYCODONE HCL 5 MG PO TABS: 5.0000 mg | ORAL_TABLET | ORAL | 0 refills | Status: DC | PRN

## 2023-01-27 NOTE — Progress Notes (Signed)
Surgical Physician Order Form [facility]  * Scheduling expectation : ASAP w/ Dr. Lonna Cobb   *Length of Case:   *Clearance needed: no  *Anticoagulation Instructions: Hold all anticoagulants  *Aspirin Instructions: Ok to continue all  *Post-op visit Date/Instructions:  TBD  *Diagnosis: Right UPJ Stone  *Procedure: right  Ureteroscopy w/laser lithotripsy & stent exchange (16109)   Additional orders: N/A  -Admit type: OUTpatient  -Anesthesia: General  -VTE Prophylaxis Standing Order SCD's       Other:   -Standing Lab Orders Per Anesthesia    Lab other: None  -Standing Test orders EKG/Chest x-ray per Anesthesia       Test other:   - Medications:  Cipro 400mg  IV  -Other orders:  N/A

## 2023-01-27 NOTE — Telephone Encounter (Signed)
Per Dr. Lonna Cobb, Patient is to be scheduled for Right Ureteroscopy with Laser Lithotripsy and Stent Exchange   Sherri Byrd was contacted and possible surgical dates were discussed, Tuesday November 12th, 2024 was agreed upon for surgery.   Patient was directed to call [phone] between 1-3pm the day before surgery to find out surgical arrival time.  Instructions were given not to eat or drink from midnight on the night before surgery and have a driver for the day of surgery. On the surgery day patient was instructed to enter through the Medical Mall entrance of [facility] report the Same Day Surgery desk.   Pre-Admit Testing will be in contact via phone to set up an interview with the anesthesia team to review your history and medications prior to surgery.   Reminder of this information was sent via MyChart to the patient.

## 2023-01-27 NOTE — Progress Notes (Signed)
   [facility] Surgical Posting Form  Surgery Date: Date: 02/01/2023  Surgeon: Dr. [name], MD  Inpt ( No  )   Outpt (Yes)   Obs ( No  )   Diagnosis: N20.1 Right Ureteral Stone  -CPT: [phone]  Surgery: Right Ureteroscopy with Laser Lithotripsy and Stent Exchange  Stop Anticoagulations: Yes and hold ASA  Cardiac/Medical/Pulmonary Clearance needed: no  *Orders entered into EPIC  Date: 01/27/23   *Case booked in EPIC  Date: 01/27/23  *Notified pt of Surgery: Date: 01/27/23  PRE-OP UA & CX: no  *Placed into Prior Authorization Work Level Green Date: 01/27/23  Assistant/laser/rep:No

## 2023-01-28 DIAGNOSIS — Z029 Encounter for administrative examinations, unspecified: Secondary | ICD-10-CM

## 2023-01-30 LAB — CULTURE, URINE COMPREHENSIVE

## 2023-01-31 ENCOUNTER — Other Ambulatory Visit: Payer: Self-pay

## 2023-01-31 ENCOUNTER — Encounter
Admission: RE | Admit: 2023-01-31 | Discharge: 2023-01-31 | Disposition: A | Discharge status: Home / Self Care | Payer: 59 | Source: Ambulatory Visit | Attending: Urology | Admitting: Urology

## 2023-01-31 HISTORY — DX: Other specified postprocedural states: R11.2

## 2023-01-31 HISTORY — DX: Other specified postprocedural states: Z98.890

## 2023-01-31 HISTORY — DX: Sepsis, unspecified organism: A41.9

## 2023-01-31 HISTORY — DX: Personal history of urinary calculi: Z87.442

## 2023-01-31 NOTE — Patient Instructions (Signed)
Your procedure is scheduled on: Tuesday 02/01/23 To find out your arrival time, please call [phone] between 1PM - 3PM on:  Monday 01/31/23  Report to the Registration Desk on the 1st floor of the Medical Mall. Free Valet parking is available.  If your arrival time is 6:00 am, do not arrive before that time as the Medical Mall entrance doors do not open until 6:00 am.  REMEMBER: Instructions that are not followed completely may result in serious medical risk, up to and including death; or upon the discretion of your surgeon and anesthesiologist your surgery may need to be rescheduled.  Do not eat food or drink any liquids after midnight the night before surgery.  No gum chewing or hard candies.  One week prior to surgery: Stop Anti-inflammatories (NSAIDS) such as Advil, Aleve, Ibuprofen, Motrin, Naproxen, Naprosyn and Aspirin based products such as Excedrin, Goody's Powder, BC Powder. You may however, continue to take Tylenol if needed for pain up until the day of surgery.  Stop ANY OVER THE COUNTER supplements or vitamins until after surgery.  Continue taking all prescribed medications with the exception of the following: Hold Ibuprofen until after your procedure.  TAKE ONLY THESE MEDICATIONS THE MORNING OF SURGERY WITH A SIP OF WATER:  oxybutynin (DITROPAN) 5 MG tablet if needed oxyCODONE (OXY IR/ROXICODONE) 5-10 MG immediate release tablet if needed  No Alcohol for 24 hours before or after surgery.  No Smoking including e-cigarettes for 24 hours before surgery.  No chewable tobacco products for at least 6 hours before surgery.  No nicotine patches on the day of surgery.  Do not use any "recreational" drugs for at least a week (preferably 2 weeks) before your surgery.  Please be advised that the combination of cocaine and anesthesia may have negative outcomes, up to and including death. If you test positive for cocaine, your surgery will be cancelled.  On the morning of  surgery brush your teeth with toothpaste and water, you may rinse your mouth with mouthwash if you wish. Do not swallow any toothpaste or mouthwash.  Use CHG Soap or wipes as directed on instruction sheet. Shower with your regular soap prior to your arrival.  Do not wear lotions, powders, or perfumes.   Do not shave body hair from the neck down 48 hours before surgery.  Wear comfortable clothing (specific to your surgery type) to the hospital.  Do not wear jewelry, make-up, hairpins, clips or nail polish.  For welded (permanent) jewelry: bracelets, anklets, waist bands, etc.  Please have this removed prior to surgery.  If it is not removed, there is a chance that hospital personnel will need to cut it off on the day of surgery. Contact lenses, hearing aids and dentures may not be worn into surgery.  Do not bring valuables to the hospital. [facility] is not responsible for any missing/lost belongings or valuables.   Notify your doctor if there is any change in your medical condition (cold, fever, infection).  If you are being discharged the day of surgery, you will not be allowed to drive home. You will need a responsible individual to drive you home and stay with you for 24 hours after surgery.   If you are taking public transportation, you will need to have a responsible individual with you.  If you are being admitted to the hospital overnight, leave your suitcase in the car. After surgery it may be brought to your room.  In case of increased patient census, it may  be necessary for you, the patient, to continue your postoperative care in the Same Day Surgery department.  After surgery, you can help prevent lung complications by doing breathing exercises.  Take deep breaths and cough every 1-2 hours. Your doctor may order a device called an Incentive Spirometer to help you take deep breaths. When coughing or sneezing, hold a pillow firmly against your incision with both hands. This is  called "splinting." Doing this helps protect your incision. It also decreases belly discomfort.  Surgery Visitation Policy:  Patients undergoing a surgery or procedure may have two family members or support persons with them as long as the person is not COVID-19 positive or experiencing its symptoms.   Inpatient Visitation:    Visiting hours are 7 a.m. to 8 p.m. Up to four visitors are allowed at one time in a patient room. The visitors may rotate out with other people during the day. One designated support person (adult) may remain overnight.  Please call the Pre-admissions Testing Dept. at [phone] if you have any questions about these instructions.

## 2023-02-01 ENCOUNTER — Other Ambulatory Visit: Payer: Self-pay

## 2023-02-01 ENCOUNTER — Ambulatory Visit: Payer: 59

## 2023-02-01 ENCOUNTER — Encounter
Admission: RE | Disposition: A | Discharge status: Home / Self Care | Payer: Self-pay | Source: Ambulatory Visit | Attending: Urology

## 2023-02-01 ENCOUNTER — Encounter: Payer: Self-pay | Admitting: Urology

## 2023-02-01 ENCOUNTER — Ambulatory Visit
Admission: RE | Admit: 2023-02-01 | Discharge: 2023-02-01 | Disposition: A | Discharge status: Home / Self Care | Payer: 59 | Source: Ambulatory Visit | Attending: Urology | Admitting: Urology

## 2023-02-01 ENCOUNTER — Ambulatory Visit: Payer: 59 | Admitting: Certified Registered"

## 2023-02-01 DIAGNOSIS — N201 Calculus of ureter: Secondary | ICD-10-CM

## 2023-02-01 DIAGNOSIS — R310 Gross hematuria: Secondary | ICD-10-CM | POA: Diagnosis not present

## 2023-02-01 DIAGNOSIS — N200 Calculus of kidney: Secondary | ICD-10-CM | POA: Diagnosis not present

## 2023-02-01 DIAGNOSIS — K5730 Diverticulosis of large intestine without perforation or abscess without bleeding: Secondary | ICD-10-CM | POA: Insufficient documentation

## 2023-02-01 DIAGNOSIS — N132 Hydronephrosis with renal and ureteral calculous obstruction: Secondary | ICD-10-CM | POA: Diagnosis present

## 2023-02-01 HISTORY — PX: CYSTOSCOPY W/ RETROGRADES: SHX1426

## 2023-02-01 HISTORY — PX: CYSTOSCOPY/URETEROSCOPY/HOLMIUM LASER/STENT PLACEMENT: SHX6546

## 2023-02-01 SURGERY — CYSTOSCOPY/URETEROSCOPY/HOLMIUM LASER/STENT PLACEMENT
Anesthesia: General | Laterality: Right

## 2023-02-01 MED ORDER — FENTANYL CITRATE (PF) 100 MCG/2ML IJ SOLN
INTRAMUSCULAR | Status: AC
  Filled 2023-02-01: qty 2

## 2023-02-01 MED ORDER — LIDOCAINE HCL (CARDIAC) PF 100 MG/5ML IV SOSY
PREFILLED_SYRINGE | INTRAVENOUS | Status: DC | PRN
  Administered 2023-02-01: 100 mg via INTRAVENOUS

## 2023-02-01 MED ORDER — FENTANYL CITRATE (PF) 100 MCG/2ML IJ SOLN
INTRAMUSCULAR | Status: DC | PRN
  Administered 2023-02-01 (×2): 50 ug via INTRAVENOUS

## 2023-02-01 MED ORDER — SODIUM CHLORIDE 0.9 % IR SOLN
Status: DC | PRN
  Administered 2023-02-01: 3000 mL

## 2023-02-01 MED ORDER — OXYCODONE HCL 5 MG PO TABS: 5.0000 mg | ORAL_TABLET | Freq: Four times a day (QID) | ORAL | 0 refills | Status: DC | PRN

## 2023-02-01 MED ORDER — SOLIFENACIN SUCCINATE 10 MG PO TABS: 10.0000 mg | ORAL_TABLET | Freq: Every day | ORAL | 0 refills | Status: DC

## 2023-02-01 MED ORDER — FENTANYL CITRATE (PF) 100 MCG/2ML IJ SOLN
25.0000 ug | INTRAMUSCULAR | Status: DC | PRN
  Administered 2023-02-01: 25 ug via INTRAVENOUS
  Administered 2023-02-01: 50 ug via INTRAVENOUS
  Administered 2023-02-01: 25 ug via INTRAVENOUS

## 2023-02-01 MED ORDER — ONDANSETRON HCL 4 MG/2ML IJ SOLN
INTRAMUSCULAR | Status: DC | PRN
  Administered 2023-02-01: 4 mg via INTRAVENOUS

## 2023-02-01 MED ORDER — ORAL CARE MOUTH RINSE: 15.0000 mL | Freq: Once | OROMUCOSAL | Status: AC

## 2023-02-01 MED ORDER — OXYCODONE HCL 5 MG PO TABS
ORAL_TABLET | ORAL | Status: AC
  Filled 2023-02-01: qty 1

## 2023-02-01 MED ORDER — CHLORHEXIDINE GLUCONATE 0.12 % MT SOLN
OROMUCOSAL | Status: AC
  Filled 2023-02-01: qty 15

## 2023-02-01 MED ORDER — ROCURONIUM BROMIDE 100 MG/10ML IV SOLN
INTRAVENOUS | Status: DC | PRN
  Administered 2023-02-01: 60 mg via INTRAVENOUS

## 2023-02-01 MED ORDER — CHLORHEXIDINE GLUCONATE 0.12 % MT SOLN
15.0000 mL | Freq: Once | OROMUCOSAL | Status: AC
  Administered 2023-02-01: 15 mL via OROMUCOSAL

## 2023-02-01 MED ORDER — LACTATED RINGERS IV SOLN
INTRAVENOUS | Status: DC
Start: 2023-02-01 — End: 2023-02-01

## 2023-02-01 MED ORDER — MIDAZOLAM HCL 5 MG/5ML IJ SOLN
INTRAMUSCULAR | Status: DC | PRN
  Administered 2023-02-01: 2 mg via INTRAVENOUS

## 2023-02-01 MED ORDER — SUGAMMADEX SODIUM 200 MG/2ML IV SOLN
INTRAVENOUS | Status: DC | PRN
  Administered 2023-02-01: 200 mg via INTRAVENOUS

## 2023-02-01 MED ORDER — OXYCODONE HCL 5 MG/5ML PO SOLN: 5.0000 mg | Freq: Once | ORAL | Status: AC | PRN

## 2023-02-01 MED ORDER — DEXMEDETOMIDINE HCL IN NACL 80 MCG/20ML IV SOLN
INTRAVENOUS | Status: DC | PRN
  Administered 2023-02-01: 8 ug via INTRAVENOUS

## 2023-02-01 MED ORDER — CIPROFLOXACIN IN D5W 400 MG/200ML IV SOLN
INTRAVENOUS | Status: AC
  Filled 2023-02-01: qty 200

## 2023-02-01 MED ORDER — PROPOFOL 10 MG/ML IV BOLUS
INTRAVENOUS | Status: DC | PRN
  Administered 2023-02-01: 200 mg via INTRAVENOUS

## 2023-02-01 MED ORDER — OXYCODONE HCL 5 MG PO TABS
5.0000 mg | ORAL_TABLET | Freq: Once | ORAL | Status: AC | PRN
  Administered 2023-02-01: 5 mg via ORAL

## 2023-02-01 MED ORDER — CIPROFLOXACIN IN D5W 400 MG/200ML IV SOLN
400.0000 mg | INTRAVENOUS | Status: AC
  Administered 2023-02-01: 400 mg via INTRAVENOUS

## 2023-02-01 MED ORDER — MIDAZOLAM HCL 2 MG/2ML IJ SOLN
INTRAMUSCULAR | Status: AC
Start: 2023-02-01 — End: ?
  Filled 2023-02-01: qty 2

## 2023-02-01 MED ORDER — PHENYLEPHRINE HCL-NACL 20-0.9 MG/250ML-% IV SOLN
INTRAVENOUS | Status: DC | PRN
  Administered 2023-02-01: 20 ug/min via INTRAVENOUS

## 2023-02-01 MED ORDER — PHENYLEPHRINE 80 MCG/ML (10ML) SYRINGE FOR IV PUSH (FOR BLOOD PRESSURE SUPPORT)
PREFILLED_SYRINGE | INTRAVENOUS | Status: DC | PRN
  Administered 2023-02-01 (×3): 40 ug via INTRAVENOUS
  Administered 2023-02-01: 80 ug via INTRAVENOUS

## 2023-02-01 MED ORDER — IOHEXOL 180 MG/ML  SOLN
INTRAMUSCULAR | Status: DC | PRN
  Administered 2023-02-01: 10 mL

## 2023-02-01 SURGICAL SUPPLY — 26 items
BAG DRAIN SIEMENS DORNER NS (MISCELLANEOUS) ×2 IMPLANT
BASKET ZERO TIP 1.9FR (BASKET) IMPLANT
BRUSH SCRUB EZ 1% IODOPHOR (MISCELLANEOUS) ×2 IMPLANT
BRUSH SCRUB EZ 4% CHG (MISCELLANEOUS) IMPLANT
CATH URET FLEX-TIP 2 LUMEN 10F (CATHETERS) IMPLANT
CATH URETL OPEN END 6X70 (CATHETERS) IMPLANT
CNTNR URN SCR LID CUP LEK RST (MISCELLANEOUS) IMPLANT
CONT SPEC 4OZ STRL OR WHT (MISCELLANEOUS)
DRAPE UTILITY 15X26 TOWEL STRL (DRAPES) ×2 IMPLANT
FIBER LASER MOSES 200 DFL (Laser) IMPLANT
GLOVE BIOGEL PI IND STRL 7.5 (GLOVE) ×2 IMPLANT
GOWN STRL REUS W/ TWL LRG LVL3 (GOWN DISPOSABLE) ×2 IMPLANT
GOWN STRL REUS W/ TWL XL LVL3 (GOWN DISPOSABLE) ×2 IMPLANT
GOWN STRL REUS W/TWL LRG LVL3 (GOWN DISPOSABLE) ×2
GOWN STRL REUS W/TWL XL LVL3 (GOWN DISPOSABLE) ×2
GUIDEWIRE STR DUAL SENSOR (WIRE) ×2 IMPLANT
IV NS IRRIG 3000ML ARTHROMATIC (IV SOLUTION) ×2 IMPLANT
KIT TURNOVER CYSTO (KITS) ×2 IMPLANT
PACK CYSTO AR (MISCELLANEOUS) ×2 IMPLANT
SET CYSTO W/LG BORE CLAMP LF (SET/KITS/TRAYS/PACK) ×2 IMPLANT
SHEATH NAVIGATOR HD 12/14X36 (SHEATH) IMPLANT
STENT URET 6FRX24 CONTOUR (STENTS) IMPLANT
STENT URET 6FRX26 CONTOUR (STENTS) IMPLANT
SURGILUBE 2OZ TUBE FLIPTOP (MISCELLANEOUS) ×2 IMPLANT
VALVE UROSEAL ADJ ENDO (VALVE) IMPLANT
WATER STERILE IRR 500ML POUR (IV SOLUTION) ×2 IMPLANT

## 2023-02-01 NOTE — Interval H&P Note (Signed)
History and Physical Interval Note:  02/01/2023 12:02 PM  [name]  has presented today for surgery, with the diagnosis of Right UreteroPelvic Junction Stone.  The various methods of treatment have been discussed with the patient and family. After consideration of risks, benefits and other options for treatment, the patient has consented to  Procedure(s): CYSTOSCOPY/URETEROSCOPY/HOLMIUM LASER/STENT EXCHANGE (Right) as a surgical intervention.  The patient's history has been reviewed, patient examined, no change in status, stable for surgery.  I have reviewed the patient's chart and labs.  Questions were answered to the patient's satisfaction.    CV:RRR Lungs:clear  Riki Altes

## 2023-02-01 NOTE — Transfer of Care (Signed)
Immediate Anesthesia Transfer of Care Note  Patient: Sherri Byrd  Procedure(s) Performed: CYSTOSCOPY/URETEROSCOPY/HOLMIUM LASER/STENT EXCHANGE (Right) CYSTOSCOPY WITH RETROGRADE PYELOGRAM  Patient Location: PACU  Anesthesia Type:General  Level of Consciousness: awake, alert , and oriented  Airway & Oxygen Therapy: Patient Spontanous Breathing  Post-op Assessment: Report given to RN and Post -op Vital signs reviewed and stable  Post vital signs: stable  Last Vitals:  Vitals Value Taken Time  BP 103/55 02/01/23 1347  Temp    Pulse 77 02/01/23 1350  Resp 16 02/01/23 1350  SpO2 99 % 02/01/23 1350  Vitals shown include unfiled device data.  Last Pain:  Vitals:   02/01/23 0940  TempSrc: Temporal  PainSc: 8          Complications: No notable events documented.

## 2023-02-01 NOTE — Anesthesia Postprocedure Evaluation (Signed)
Anesthesia Post Note  Patient: ZEMORA GMEREK  Procedure(s) Performed: CYSTOSCOPY/URETEROSCOPY/HOLMIUM LASER/STENT EXCHANGE (Right) CYSTOSCOPY WITH RETROGRADE PYELOGRAM  Patient location during evaluation: PACU Anesthesia Type: General Level of consciousness: awake and alert Pain management: pain level controlled Vital Signs Assessment: post-procedure vital signs reviewed and stable Respiratory status: spontaneous breathing, nonlabored ventilation, respiratory function stable and patient connected to nasal cannula oxygen Cardiovascular status: blood pressure returned to baseline and stable Postop Assessment: no apparent nausea or vomiting Anesthetic complications: no Comments: Patient complained of chest tightness in PACU. 12L EKG showed NSR, SpO2 100%. Discussed with patient that this may be due to increased gas in her stomach 2/2 BMV prior to intubation.    There were no known notable events for this encounter.   Last Vitals:  Vitals:   02/01/23 1415 02/01/23 1420  BP:    Pulse: 81 77  Resp: 16 14  Temp:    SpO2: 100% 100%    Last Pain:  Vitals:   02/01/23 1415  TempSrc:   PainSc: 10-Worst pain ever                 Louie Boston

## 2023-02-01 NOTE — Discharge Instructions (Signed)
DISCHARGE INSTRUCTIONS FOR KIDNEY STONE/URETERAL STENT   MEDICATIONS:  1. Resume all your other meds from home.  2.  AZO (over-the-counter) can help with the burning/stinging when you urinate. 3.  A different medication was sent to your pharmacy to help with stent irritation.    ACTIVITY:  1. May resume regular activities in 24 hours. 2. No driving while on narcotic pain medications  3. Drink plenty of water  4. Continue to walk at home - you can still get blood clots when you are at home, so keep active, but don't over do it.  5. May return to work/school tomorrow or when you feel ready   BATHING:  1. You can shower. 2. You have a string coming from your urethra: The stent string is attached to your ureteral stent. Do not pull on this.   SIGNS/SYMPTOMS TO CALL:  Common postoperative symptoms include urinary frequency, urgency, bladder spasm and blood in the urine  Please call us if you have a fever greater than 101.5, uncontrolled nausea/vomiting, uncontrolled pain, dizziness, unable to urinate, excessively bloody urine, chest pain, shortness of breath, leg swelling, leg pain, or any other concerns or questions.   You can reach [place] at [phone].   FOLLOW-UP:  1. You will be contacted for a follow-up appointment for stent removal and x-ray 2 days prior to stent removal appointment

## 2023-02-01 NOTE — Anesthesia Preprocedure Evaluation (Addendum)
Anesthesia Evaluation  Patient identified by MRN, date of birth, ID band Patient awake  General Assessment Comment:  Obstructing infected ureteral stone. Had nausea and vomiting yesterday. Reports nausea this morning  Reviewed: Allergy & Precautions, NPO status , Patient's Chart, lab work & pertinent test results  History of Anesthesia Complications Negative for: history of anesthetic complications  Airway Mallampati: III  TM Distance: >3 FB Neck ROM: full    Dental  (+) Chipped, Dental Advidsory Given   Pulmonary neg pulmonary ROS, neg sleep apnea, neg COPD, Patient abstained from smoking.Not current smoker   Pulmonary exam normal breath sounds clear to auscultation       Cardiovascular Exercise Tolerance: Good (-) hypertension(-) CAD and (-) Past MI negative cardio ROS Normal cardiovascular exam(-) dysrhythmias  Rhythm:Regular Rate:Normal - Systolic murmurs    Neuro/Psych negative neurological ROS  negative psych ROS   GI/Hepatic negative GI ROS, Neg liver ROS,neg GERD  ,,  Endo/Other  negative endocrine ROSneg diabetes  No longer takes GLP1 agonist  Renal/GU      Musculoskeletal   Abdominal   Peds  Hematology negative hematology ROS (+)   Anesthesia Other Findings Past Medical History: No date: History of kidney stones No date: Kidney stone No date: PONV (postoperative nausea and vomiting) No date: Sepsis [facility] [address])  Past Surgical History: 01/18/2023: CYSTOSCOPY WITH STENT PLACEMENT; Right     Comment:  Procedure: CYSTOSCOPY WITH RIGHT URETERAL STENT               PLACEMENT;  Surgeon: Riki Altes, MD;  Location:               [facility] ORS;  Service: Urology;  Laterality: Right; No date: kidney stone remove No date: kidney stone stent; Left  BMI    Body Mass Index: 39.82 kg/m      Reproductive/Obstetrics negative OB ROS                             Anesthesia  Physical Anesthesia Plan  ASA: 2  Anesthesia Plan: General ETT and General   Post-op Pain Management:    Induction: Intravenous  PONV Risk Score and Plan: 3 and Ondansetron, Dexamethasone and Midazolam  Airway Management Planned: Oral ETT  Additional Equipment:   Intra-op Plan:   Post-operative Plan: Extubation in OR  Informed Consent: I have reviewed the patients History and Physical, chart, labs and discussed the procedure including the risks, benefits and alternatives for the proposed anesthesia with the patient or authorized representative who has indicated his/her understanding and acceptance.     Dental Advisory Given  Plan Discussed with: Anesthesiologist, CRNA and Surgeon  Anesthesia Plan Comments: (Patient consented for risks of anesthesia including but not limited to:  - adverse reactions to medications - damage to eyes, teeth, lips or other oral mucosa - nerve damage due to positioning  - sore throat or hoarseness - Damage to heart, brain, nerves, lungs, other parts of body or loss of life  Patient voiced understanding and assent.)       Anesthesia Quick Evaluation

## 2023-02-01 NOTE — Anesthesia Procedure Notes (Signed)
Procedure Name: Intubation Date/Time: 02/01/2023 12:30 PM  Performed by: Maryla Morrow., CRNAPre-anesthesia Checklist: Patient identified, Patient being monitored, Timeout performed, Emergency Drugs available and Suction available Patient Re-evaluated:Patient Re-evaluated prior to induction Oxygen Delivery Method: Circle system utilized Preoxygenation: Pre-oxygenation with 100% oxygen Induction Type: IV induction Ventilation: Mask ventilation without difficulty Laryngoscope Size: Mac and 3 Grade View: Grade II Tube type: Oral Tube size: 6.5 mm Number of attempts: 1 Airway Equipment and Method: Stylet Placement Confirmation: ETT inserted through vocal cords under direct vision, positive ETCO2 and breath sounds checked- equal and bilateral Secured at: 21 cm Tube secured with: Tape Dental Injury: Teeth and Oropharynx as per pre-operative assessment

## 2023-02-01 NOTE — Op Note (Signed)
Preoperative diagnosis:  Right nephrolithiasis   Postoperative diagnosis:  Right nephrolithiasis  Procedure:  Cystoscopy Right ureteroscopy and stone removal Ureteroscopic laser lithotripsy Right ureteral stent exchange (5519F/24 cm)  Right retrograde pyelography with interpretation  Surgeon: Lorin Picket C. Keyondra Lagrand, M.D.  Anesthesia: General  Complications: None  Intraoperative findings:  Cystoscopy: Bladder mucosa without solid or papillary lesions.  Inflammatory changes right hemitrigone secondary to indwelling stent Ureteropyeloscopy: Ureter normal in appearance; inflammatory changes UPJ secondary to calculus though calculus had migrated to a midpole calyx.  Randall's plaques noted in several calyces Retrograde pyelography post procedure showed no filling defects, stone fragments or contrast extravasation  EBL: Minimal  Specimens: Calculus fragments for analysis   Indication: Sherri Byrd is a 62 y.o. status post stent placement 01/18/2023 for a 16 mm obstructing UPJ calculus with suspected sepsis from a urinary source.  Culture from the renal pelvis subsequently grew E. coli.  She presents today for definitive stone treatment.  After reviewing the management options for treatment, the patient elected to proceed with the above surgical procedure(s). We have discussed the potential benefits and risks of the procedure, side effects of the proposed treatment, the likelihood of the patient achieving the goals of the procedure, and any potential problems that might occur during the procedure or recuperation. Informed consent has been obtained.  Description of procedure:  The patient was taken to the operating room and general anesthesia was induced.  The patient was placed in the dorsal lithotomy position, prepped and draped in the usual sterile fashion, and preoperative antibiotics were administered. A preoperative time-out was performed.   A 21 French cystoscope sheath with obturator  was lubricated and passed per urethra.  The sheath was replaced with a 30 degree lens and panendoscopy was performed with findings as described above.  The stent was grasped with endoscopic forceps and brought out through the urethral meatus.  A 0.038 Sensor wire was then advanced through the stent into the renal pelvis under fluoroscopic guidance.  A single channel digital flexible ureteroscope was placed per urethra and the scope was advanced into the right UO alongside the guidewire without difficulty.  Under direct vision the ureteroscope was advanced into the renal pelvis and pyeloscopy was performed with findings as described above  A 200 m Moses holmium laser fiber was placed through the ureteroscope and the stone was dusted/at an initial setting of 0.3 J and frequency of 80 hz and subsequently increased to 0.3/120.  With ~ 25% of the calculus remaining the settings were placed back to 0.3/80.  Some larger fragments that chipped off the stone during dusting were removed with a 1.519F Escape basket.  Several fragments estimated at >1 mm were further dusted with noncontact laser lithotripsy.  Retrograde pyelogram was then performed through the ureteroscope and all calyces were examined under fluoroscopic guidance.  No fragments estimated at >1 mm were identified.  The ureteroscope was removed under direct vision and no ureteral fragments were noted with the exception of fine particles.  A 5519F/24 cm Contour ureteral stent was placed under fluoroscopic guidance.  The wire was then removed with an adequate stent curl noted in the renal pelvis as well as in the bladder.  The bladder was then emptied and the procedure ended.  The patient appeared to tolerate the procedure well and without complications.  After anesthetic reversal the patient was transported to the PACU in stable condition.   Plan: Schedule stent removal ~ 7 days KUB 3 days prior to stent  removal   Sherri Axon, MD

## 2023-02-01 NOTE — Progress Notes (Signed)
Patient still having pain in chest and abdomen, Dr Joelene Millin looked over EKG and spoke with patient. No New orders

## 2023-02-03 ENCOUNTER — Encounter: Payer: Self-pay | Admitting: Urology

## 2023-02-07 ENCOUNTER — Ambulatory Visit
Admission: RE | Admit: 2023-02-07 | Discharge: 2023-02-07 | Disposition: A | Discharge status: Home / Self Care | Payer: 59 | Attending: Urology | Admitting: Urology

## 2023-02-07 ENCOUNTER — Ambulatory Visit
Admission: RE | Admit: 2023-02-07 | Discharge: 2023-02-07 | Disposition: A | Discharge status: Home / Self Care | Payer: 59 | Source: Ambulatory Visit | Attending: Urology | Admitting: Urology

## 2023-02-07 ENCOUNTER — Other Ambulatory Visit: Payer: Self-pay | Admitting: *Deleted

## 2023-02-07 DIAGNOSIS — N201 Calculus of ureter: Secondary | ICD-10-CM

## 2023-02-07 LAB — CALCULI, WITH PHOTOGRAPH (CLINICAL LAB)
Calcium Oxalate Dihydrate: 30 %
Calcium Oxalate Monohydrate: 70 %
Weight Calculi: 14 mg

## 2023-02-08 ENCOUNTER — Encounter: Payer: Self-pay | Admitting: Urology

## 2023-02-09 ENCOUNTER — Ambulatory Visit (INDEPENDENT_AMBULATORY_CARE_PROVIDER_SITE_OTHER): Payer: 59 | Admitting: Urology

## 2023-02-09 ENCOUNTER — Encounter: Payer: Self-pay | Admitting: Urology

## 2023-02-09 VITALS — BP 146/82 | HR 61 | Ht 64.0 in | Wt 232.0 lb

## 2023-02-09 DIAGNOSIS — Z466 Encounter for fitting and adjustment of urinary device: Secondary | ICD-10-CM

## 2023-02-09 DIAGNOSIS — R310 Gross hematuria: Secondary | ICD-10-CM

## 2023-02-09 LAB — URINALYSIS, COMPLETE
Bilirubin, UA: NEGATIVE
Glucose, UA: NEGATIVE
Ketones, UA: NEGATIVE
Nitrite, UA: NEGATIVE
Specific Gravity, UA: 1.025 (ref 1.005–1.030)
Urobilinogen, Ur: 0.2 mg/dL (ref 0.2–1.0)
pH, UA: 6.5 (ref 5.0–7.5)

## 2023-02-09 LAB — MICROSCOPIC EXAMINATION
Epithelial Cells (non renal): >10 /[HPF] — AB (ref 0–10)
RBC, Urine: >30 /[HPF] — AB (ref 0–2)
WBC, UA: >30 /[HPF] — AB (ref 0–5)

## 2023-02-09 MED ORDER — CIPROFLOXACIN HCL 500 MG PO TABS
500.0000 mg | ORAL_TABLET | Freq: Once | ORAL | Status: AC
  Administered 2023-02-09: 500 mg via ORAL

## 2023-02-12 NOTE — Progress Notes (Unsigned)
   Indications: Patient is 62 y.o., who is s/p ureteroscopic removal of a right renal calculus on 02/01/2023.  The patient is presenting today for stent removal. KUB earlier this week showed no significant sized stone fragments  Procedure:  Flexible Cystoscopy with stent removal ([address])  Timeout was performed and the correct patient, procedure and participants were identified.    Description:  The patient was prepped and draped in the usual sterile fashion. Flexible cystosopy was performed.  The stent was visualized, grasped, and removed intact without difficulty. The patient tolerated the procedure well.  A single dose of oral antibiotics was given.  Complications:  None  Plan:  Stone analysis CaOxMono/CaOxDi 70/30 Metabolic eval discussed 3 month f/u w/KUB   [name], MD

## 2023-05-13 ENCOUNTER — Ambulatory Visit: Payer: Self-pay | Admitting: Urology

## 2023-05-29 ENCOUNTER — Other Ambulatory Visit: Payer: Self-pay | Admitting: Nurse Practitioner

## 2023-06-02 NOTE — Progress Notes (Signed)
 06/03/2023 9:27 PM   Sherri Byrd 30-Sep-1960 [id number]  Referring provider: Corky Downs, MD [address] [place],  [place] [address]  Urological history: 1.  Nephrolithiasis -Still composition of 70 % calcium oxalate monohydrate and 30 % calcium oxalate dihydrate -Previous patient of Dr. Wynn Maudlin -She has had previous ESWL and PCNL by Dr. Achilles Dunk greater than 10 years ago -right URS (01/2023)   Chief Complaint  Patient presents with   Follow-up   HPI: Sherri Byrd is a 63 y.o. female who presents today because she thinks she may have a kidney infection, patient states that she feels lots of pressure and pain   Previous records reviewed.   Her symptoms began last Friday and consisted of frequency, urgency, voiding small amounts and a low grade fever.  Patient denies any modifying or aggravating factors.  Patient denies any recent UTI's, gross hematuria, dysuria or suprapubic/flank pain.  Patient denies any fevers, chills, nausea or vomiting.    UA yellow clear, specific gravity 1.010, pH 6.5, 1+ leuks, 6-10 WBCs, 0-2 RBCs, 0-10 epithelial cells and a few bacteria.  KUB no stone seen.    PMH: Past Medical History:  Diagnosis Date   History of kidney stones    Kidney stone    PONV (postoperative nausea and vomiting)    Sepsis (HCC)     Surgical History: Past Surgical History:  Procedure Laterality Date   CYSTOSCOPY W/ RETROGRADES  02/01/2023   Procedure: CYSTOSCOPY WITH RETROGRADE PYELOGRAM;  Surgeon: Riki Altes, MD;  Location: [facility];  Service: Urology;;   CYSTOSCOPY WITH STENT PLACEMENT Right 01/18/2023   Procedure: CYSTOSCOPY WITH RIGHT URETERAL STENT PLACEMENT;  Surgeon: Riki Altes, MD;  Location: [facility];  Service: Urology;  Laterality: Right;   CYSTOSCOPY/URETEROSCOPY/HOLMIUM LASER/STENT PLACEMENT Right 02/01/2023   Procedure: CYSTOSCOPY/URETEROSCOPY/HOLMIUM LASER/STENT EXCHANGE;  Surgeon: Riki Altes, MD;  Location: [facility];  Service:  Urology;  Laterality: Right;   kidney stone remove     kidney stone stent Left     Home Medications:  Allergies as of 06/03/2023       Reactions   Prednisone Shortness Of Breath   tachycardia   Meloxicam Other (See Comments)   LE edema   Penicillin G Hives   Sulfa Antibiotics Hives   Dilaudid [hydromorphone] Palpitations   heart racing        Medication List        Accurate as of June 03, 2023 11:59 PM. If you have any questions, ask your nurse or doctor.          STOP taking these medications    CeleBREX 100 MG capsule Generic drug: celecoxib   escitalopram 20 MG tablet Commonly known as: LEXAPRO   ibuprofen 800 MG tablet Commonly known as: ADVIL   ondansetron 4 MG tablet Commonly known as: ZOFRAN   oxybutynin 5 MG tablet Commonly known as: DITROPAN   oxyCODONE 5 MG immediate release tablet Commonly known as: Oxy IR/ROXICODONE   phenazopyridine 200 MG tablet Commonly known as: PYRIDIUM   phentermine 37.5 MG tablet Commonly known as: ADIPEX-P   polyethylene glycol 17 g packet Commonly known as: MIRALAX / GLYCOLAX   senna-docusate 8.6-50 MG tablet Commonly known as: Senokot-S   solifenacin 10 MG tablet Commonly known as: VESICARE       TAKE these medications    acetaminophen 325 MG tablet Commonly known as: TYLENOL Take 2 tablets (650 mg total) by mouth every 6 (six) hours as needed for fever,  headache or mild pain (pain score 1-3).   ciprofloxacin 500 MG tablet Commonly known as: Cipro Take 1 tablet (500 mg total) by mouth 2 (two) times daily for 7 days.        Allergies:  Allergies  Allergen Reactions   Prednisone Shortness Of Breath    tachycardia   Meloxicam Other (See Comments)    LE edema   Penicillin G Hives   Sulfa Antibiotics Hives   Dilaudid [Hydromorphone] Palpitations    heart racing    Family History: Family History  Problem Relation Age of Onset   Breast cancer Maternal Aunt 75   Breast cancer Cousin 59        maternal    Social History:  reports that she has never smoked. She has never used smokeless tobacco. She reports that she does not currently use alcohol. She reports that she does not use drugs.  ROS: Pertinent ROS in HPI  Physical Exam: BP 114/72   Pulse 87   Constitutional:  Well nourished. Alert and oriented, No acute distress. HEENT:  AT, moist mucus membranes.  Trachea midline Cardiovascular: No clubbing, cyanosis, or edema. Respiratory: Normal respiratory effort, no increased work of breathing. Neurologic: Grossly intact, no focal deficits, moving all 4 extremities. Psychiatric: Normal mood and affect.    Laboratory Data: Urinalysis See epic and HPI I have reviewed the labs.   Pertinent Imaging: KUB no stone seen, radiologist interpretation still pending I have independently reviewed the films.  See HPI.    Assessment & Plan:    1. Suspected UTI -UA not impressive, but she is symptomatic   -Urine culture pending -Started empirically on Cipro 500 mg BI x 7 days, will adjust if necessary once urine culture and sensitivity results are available    2. Nephrolithiasis -KUB no stone seen    Return for pending urine culture results .  These notes generated with voice recognition software. I apologize for typographical errors.  Cloretta Ned  [facility] [address]  [address] [place], [place] [address] [phone]

## 2023-06-03 ENCOUNTER — Ambulatory Visit: Admitting: Urology

## 2023-06-03 VITALS — BP 114/72 | HR 87

## 2023-06-03 DIAGNOSIS — N200 Calculus of kidney: Secondary | ICD-10-CM

## 2023-06-03 DIAGNOSIS — R3989 Other symptoms and signs involving the genitourinary system: Secondary | ICD-10-CM

## 2023-06-03 LAB — MICROSCOPIC EXAMINATION

## 2023-06-03 LAB — URINALYSIS, COMPLETE
Bilirubin, UA: NEGATIVE
Glucose, UA: NEGATIVE
Ketones, UA: NEGATIVE
Nitrite, UA: NEGATIVE
Protein,UA: NEGATIVE
RBC, UA: NEGATIVE
Specific Gravity, UA: 1.01 (ref 1.005–1.030)
Urobilinogen, Ur: 0.2 mg/dL (ref 0.2–1.0)
pH, UA: 6.5 (ref 5.0–7.5)

## 2023-06-03 MED ORDER — CIPROFLOXACIN HCL 500 MG PO TABS: 500.0000 mg | ORAL_TABLET | Freq: Two times a day (BID) | ORAL | 0 refills | Status: AC

## 2023-06-05 ENCOUNTER — Encounter: Payer: Self-pay | Admitting: Urology

## 2023-06-07 ENCOUNTER — Other Ambulatory Visit: Payer: Self-pay | Admitting: Urology

## 2023-06-07 DIAGNOSIS — N200 Calculus of kidney: Secondary | ICD-10-CM

## 2023-06-07 LAB — CULTURE, URINE COMPREHENSIVE

## 2023-06-17 ENCOUNTER — Ambulatory Visit (INDEPENDENT_AMBULATORY_CARE_PROVIDER_SITE_OTHER): Payer: 59 | Admitting: Urology

## 2023-06-17 ENCOUNTER — Ambulatory Visit
Admission: RE | Admit: 2023-06-17 | Discharge: 2023-06-17 | Disposition: A | Discharge status: Home / Self Care | Source: Ambulatory Visit | Attending: Urology | Admitting: Urology

## 2023-06-17 ENCOUNTER — Encounter: Payer: Self-pay | Admitting: Urology

## 2023-06-17 VITALS — BP 142/78 | HR 108 | Ht 70.0 in | Wt 238.0 lb

## 2023-06-17 DIAGNOSIS — N200 Calculus of kidney: Secondary | ICD-10-CM | POA: Insufficient documentation

## 2023-06-17 DIAGNOSIS — Z87442 Personal history of urinary calculi: Secondary | ICD-10-CM

## 2023-06-17 NOTE — Progress Notes (Signed)
 I, Maysun Anabel Bene, acting as a scribe for Riki Altes, MD., have documented all relevant documentation on the behalf of Riki Altes, MD, as directed by Riki Altes, MD while in the presence of Riki Altes, MD.  06/17/2023 6:02 PM   Sherri Byrd 08-10-1960 [id number]  Referring provider: Corky Downs, MD [address] [place],  [place] [address]  Chief Complaint  Patient presents with   Nephrolithiasis   Urologic history 1. Nephrolithiasis Right ureteral stent placed 01/18/23 for a 16mm right UPJ calculus with possible UTI.  Definitive stone treatment 02/01/2023 with right ureteroscopy and laser lithotripsy.  Stent removed 02/09/2023 without problems.  Stone analysis CaOX mono/CaOx di 70/30.   HPI: Sherri Byrd is a 63 y.o. female presents for a 3 month follow-up.   She had an acute visit with Michiel Cowboy 06/03/23 with complaints of frequency, urgency, and voiding small amounts. UA was 6-10 WBC's. She was started empirically on Cipro. Urine culture was negative, however she states her symptoms resolved on antibiotics.  She has no complaints today   PMH: Past Medical History:  Diagnosis Date   History of kidney stones    Kidney stone    PONV (postoperative nausea and vomiting)    Sepsis (HCC)     Surgical History: Past Surgical History:  Procedure Laterality Date   CYSTOSCOPY W/ RETROGRADES  02/01/2023   Procedure: CYSTOSCOPY WITH RETROGRADE PYELOGRAM;  Surgeon: Riki Altes, MD;  Location: [facility];  Service: Urology;;   CYSTOSCOPY WITH STENT PLACEMENT Right 01/18/2023   Procedure: CYSTOSCOPY WITH RIGHT URETERAL STENT PLACEMENT;  Surgeon: Riki Altes, MD;  Location: [facility];  Service: Urology;  Laterality: Right;   CYSTOSCOPY/URETEROSCOPY/HOLMIUM LASER/STENT PLACEMENT Right 02/01/2023   Procedure: CYSTOSCOPY/URETEROSCOPY/HOLMIUM LASER/STENT EXCHANGE;  Surgeon: Riki Altes, MD;  Location: [facility];  Service: Urology;  Laterality:  Right;   kidney stone remove     kidney stone stent Left     Home Medications:  Allergies as of 06/17/2023       Reactions   Prednisone Shortness Of Breath   tachycardia   Meloxicam Other (See Comments)   LE edema   Penicillin G Hives   Sulfa Antibiotics Hives   Dilaudid [hydromorphone] Palpitations   heart racing        Medication List        Accurate as of June 17, 2023  6:02 PM. If you have any questions, ask your nurse or doctor.          acetaminophen 325 MG tablet Commonly known as: TYLENOL Take 2 tablets (650 mg total) by mouth every 6 (six) hours as needed for fever, headache or mild pain (pain score 1-3).        Allergies:  Allergies  Allergen Reactions   Prednisone Shortness Of Breath    tachycardia   Meloxicam Other (See Comments)    LE edema   Penicillin G Hives   Sulfa Antibiotics Hives   Dilaudid [Hydromorphone] Palpitations    heart racing    Family History: Family History  Problem Relation Age of Onset   Breast cancer Maternal Aunt 24   Breast cancer Cousin 80       maternal    Social History:  reports that she has never smoked. She has never used smokeless tobacco. She reports that she does not currently use alcohol. She reports that she does not use drugs.   Physical Exam: BP (!) 142/78   Pulse Marland Kitchen)  108   Ht 5\' 10"  (1.778 m)   Wt 238 lb (108 kg)   BMI 34.15 kg/m   Constitutional:  Alert and oriented, No acute distress. HEENT: [place] AT Respiratory: Normal respiratory effort, no increased work of breathing. Psychiatric: Normal mood and affect.   Pertinent Imaging: KUB performed earlier today was personally reviewed and interpreted. No calcifications suspicious for urinary tract stones overlying the renal outlines or expected ureteral course.    Assessment & Plan:    1. Personal history of urinary calculi Doing well status post-ureteroscopic removal right UPJ calculus 01/2023.  1 year follow up with KUB and instructed to  call earlier for flank pain/renal colic \\I  have reviewed the above documentation for accuracy and completeness, and I agree with the above.   Riki Altes, MD  [facility] [address], [address] [place], [place] [address] [phone]

## 2023-07-18 ENCOUNTER — Telehealth: Payer: Self-pay

## 2023-07-18 DIAGNOSIS — R1084 Generalized abdominal pain: Secondary | ICD-10-CM

## 2023-07-18 DIAGNOSIS — N200 Calculus of kidney: Secondary | ICD-10-CM

## 2023-07-18 NOTE — Telephone Encounter (Signed)
 Pt called triage stating she is passing a kidney stone, states pain is 7/10 in her left lower back, is taking OTC tylenol  and it is helping with pain. Light blood in urine no clots, no issues urinating. No fever or nausea. Pt was advise to go to the ER by accesnurse. Pt states she didn't want to be left with a hospital bill and knows she's passing a stone. Pt wanted to know if there were any recommendations or something that could be sent in to make passing her stone easier. She was seen in clinic a couple of weeks ago with a KUB. Please advise.

## 2023-07-19 NOTE — Telephone Encounter (Signed)
 Recent KUB was negative.  Send in Rx silodosin 8 mg daily.  Recommend renal stone CT for continued pain

## 2023-07-20 MED ORDER — SILODOSIN 8 MG PO CAPS: 8.0000 mg | ORAL_CAPSULE | Freq: Every day | ORAL | 1 refills | Status: AC

## 2023-07-20 NOTE — Addendum Note (Signed)
 Addended by: Larnell Granlund M on: 07/20/2023 09:15 AM   Modules accepted: Orders

## 2023-07-20 NOTE — Telephone Encounter (Signed)
 Patient aware of results and recommendations.  She is still experiencing discomfort and would like to proceed with the CT.  Order placed and patient given number to schedule.

## 2023-08-11 ENCOUNTER — Telehealth: Payer: Self-pay

## 2023-08-11 ENCOUNTER — Encounter: Payer: Self-pay | Admitting: Nurse Practitioner

## 2023-08-11 NOTE — Telephone Encounter (Signed)
 Copied from CRM [id number]. Topic: Appointments - Scheduling Inquiry for Clinic >> Aug 11, 2023 10:26 AM Sherri Byrd H wrote: Reason for CRM: Pt is wanting to become a new pt at this practice. Please call to schedule.

## 2023-08-11 NOTE — Telephone Encounter (Signed)
 This should not have been converted to telephone encounter.  CRM has been sent to corrections as this should have been done at the time of the call.

## 2023-08-11 NOTE — Telephone Encounter (Signed)
 I left a voicemail for patient letting her know that we need to reschedule her appointment on 08/12/2023 at 9:40am with Tona Francis, NP, as provider will be out of the office.  I also sent a letter to patient via MyChart.  E2C2 - when patient calls back, please assist her with rescheduling this visit.

## 2023-08-12 ENCOUNTER — Ambulatory Visit: Payer: 59 | Admitting: Nurse Practitioner

## 2023-08-26 ENCOUNTER — Ambulatory Visit
Admission: RE | Admit: 2023-08-26 | Discharge: 2023-08-26 | Disposition: A | Discharge status: Home / Self Care | Source: Ambulatory Visit | Attending: Urology | Admitting: Urology

## 2023-08-26 DIAGNOSIS — N200 Calculus of kidney: Secondary | ICD-10-CM | POA: Insufficient documentation

## 2023-08-26 DIAGNOSIS — R1084 Generalized abdominal pain: Secondary | ICD-10-CM | POA: Insufficient documentation

## 2023-08-28 ENCOUNTER — Ambulatory Visit: Payer: Self-pay | Admitting: Urology

## 2023-09-29 ENCOUNTER — Ambulatory Visit (INDEPENDENT_AMBULATORY_CARE_PROVIDER_SITE_OTHER): Admitting: Urology

## 2023-09-29 ENCOUNTER — Encounter: Payer: Self-pay | Admitting: Urology

## 2023-09-29 ENCOUNTER — Telehealth: Payer: Self-pay | Admitting: Urology

## 2023-09-29 VITALS — BP 134/73 | HR 93 | Ht 64.0 in | Wt 245.0 lb

## 2023-09-29 DIAGNOSIS — R3989 Other symptoms and signs involving the genitourinary system: Secondary | ICD-10-CM | POA: Diagnosis not present

## 2023-09-29 LAB — MICROSCOPIC EXAMINATION: WBC, UA: >30 /HPF — AB (ref 0–5)

## 2023-09-29 LAB — URINALYSIS, COMPLETE

## 2023-09-29 MED ORDER — CIPROFLOXACIN HCL 250 MG PO TABS: 250.0000 mg | ORAL_TABLET | Freq: Two times a day (BID) | ORAL | 0 refills | Status: AC

## 2023-09-29 NOTE — Telephone Encounter (Signed)
 Patient called at 9:10 AM to schedule an appointment for UTI symptoms. Sooner appointments, triage, and the next available appointment were all offered, but the patient declined each. The patient was also advised to consider urgent care or the emergency room, which they also declined. Patient called back at 10:18 AM requesting a same-day appointment. And was offered a same day for 3:20 PM.

## 2023-09-29 NOTE — Progress Notes (Signed)
 09/29/2023 3:48 PM   Sherri Byrd 05/19/1960 [id number]  Referring provider: Britta King, MD [address] [place],  [place] [address]  Urological history: 1.  Nephrolithiasis -Still composition of 70 % calcium oxalate monohydrate and 30 % calcium oxalate dihydrate -Previous patient of Dr. Bobetta -She has had previous ESWL and PCNL by Dr. Ike greater than 10 years ago -right URS (01/2023)   Chief Complaint  Patient presents with   Suspected UTI   HPI: Sherri Byrd is a 63 y.o. female who presents today because she thinks she may have an UTI.     Previous records reviewed.   On Sunday, she had a sudden onset of dysuria, urgency and suprapubic pain.  Patient denies any modifying or aggravating factors.  Patient denies any recent UTI's, gross hematuria or flank pain.  Patient denies any fevers, chills, nausea or vomiting.    UA with leukocytes and bacteria, concerning for infection  PMH: Past Medical History:  Diagnosis Date   History of kidney stones    Kidney stone    PONV (postoperative nausea and vomiting)    Sepsis (HCC)     Surgical History: Past Surgical History:  Procedure Laterality Date   CYSTOSCOPY W/ RETROGRADES  02/01/2023   Procedure: CYSTOSCOPY WITH RETROGRADE PYELOGRAM;  Surgeon: Twylla Glendia BROCKS, MD;  Location: [facility];  Service: Urology;;   CYSTOSCOPY WITH STENT PLACEMENT Right 01/18/2023   Procedure: CYSTOSCOPY WITH RIGHT URETERAL STENT PLACEMENT;  Surgeon: Twylla Glendia BROCKS, MD;  Location: [facility];  Service: Urology;  Laterality: Right;   CYSTOSCOPY/URETEROSCOPY/HOLMIUM LASER/STENT PLACEMENT Right 02/01/2023   Procedure: CYSTOSCOPY/URETEROSCOPY/HOLMIUM LASER/STENT EXCHANGE;  Surgeon: Twylla Glendia BROCKS, MD;  Location: [facility];  Service: Urology;  Laterality: Right;   kidney stone remove     kidney stone stent Left     Home Medications:  Allergies as of 09/29/2023       Reactions   Prednisone Shortness Of Breath   tachycardia   Meloxicam  Other (See Comments)   LE edema   Penicillin G Hives   Sulfa Antibiotics Hives   Dilaudid [hydromorphone] Palpitations   heart racing        Medication List        Accurate as of September 29, 2023  3:48 PM. If you have any questions, ask your nurse or doctor.          acetaminophen  325 MG tablet Commonly known as: TYLENOL  Take 2 tablets (650 mg total) by mouth every 6 (six) hours as needed for fever, headache or mild pain (pain score 1-3).   ciprofloxacin  250 MG tablet Commonly known as: Cipro  Take 1 tablet (250 mg total) by mouth 2 (two) times daily. Started by: Sherri Byrd   silodosin  8 MG Caps capsule Commonly known as: RAPAFLO  Take 1 capsule (8 mg total) by mouth daily with breakfast.        Allergies:  Allergies  Allergen Reactions   Prednisone Shortness Of Breath    tachycardia   Meloxicam Other (See Comments)    LE edema   Penicillin G Hives   Sulfa Antibiotics Hives   Dilaudid [Hydromorphone] Palpitations    heart racing    Family History: Family History  Problem Relation Age of Onset   Breast cancer Maternal Aunt 52   Breast cancer Cousin 61       maternal    Social History:  reports that she has never smoked. She has never used smokeless tobacco. She reports that she does  not currently use alcohol. She reports that she does not use drugs.  ROS: Pertinent ROS in HPI  Physical Exam: BP 134/73 (BP Location: Left Arm, Patient Position: Sitting, Cuff Size: Large)   Pulse 93   Ht 5' 4 (1.626 m)   Wt 245 lb (111.1 kg)   BMI 42.05 kg/m   Constitutional:  Well nourished. Alert and oriented, No acute distress. HEENT: [place] AT, moist mucus membranes.  Trachea midline Cardiovascular: No clubbing, cyanosis, or edema. Respiratory: Normal respiratory effort, no increased work of breathing. Neurologic: Grossly intact, no focal deficits, moving all 4 extremities. Psychiatric: Normal mood and affect.    Laboratory Data: See epic and HPI I have  reviewed the labs.   Pertinent Imaging: N/A  Assessment & Plan:    1. Suspected UTI  - UA grossly infected  - Urine culture pending - Started empirically on ciprofloxacin  250 mg BID x 7 days, will adjust if necessary once urine culture and sensitivity results are available  - Advised patient to increase fluid intake and monitor symptoms - Counseled on UTI prevention (hydration, post-coital voiding, wiping from to back) - follow-up or call if no improvement within 48-72 hours or if symptoms worsen (fever, back pain)    Return for Follow up pending labs.  These notes generated with voice recognition software. I apologize for typographical errors.  Sherri Byrd  [facility] [address]  [address] [place], [place] [address] [phone]

## 2023-10-05 ENCOUNTER — Ambulatory Visit: Payer: Self-pay | Admitting: Urology

## 2023-10-05 LAB — CULTURE, URINE COMPREHENSIVE

## 2023-10-10 ENCOUNTER — Ambulatory Visit: Admitting: Physician Assistant

## 2023-11-03 ENCOUNTER — Ambulatory Visit: Admitting: Nurse Practitioner

## 2024-01-12 ENCOUNTER — Telehealth: Payer: Self-pay

## 2024-01-12 NOTE — Telephone Encounter (Signed)
 Pt sent message in complaining of blood in urine. She was instructed by a triage nurse outside of our clinic to seek care in ED but pt did not feel it was needed at the time. Pt was ok with waiting until next week for appt and scheduled then. She was instructed that if she started running a fever or having severe pain she did need to seek ER care.

## 2024-01-18 ENCOUNTER — Ambulatory Visit: Admitting: Urology

## 2024-02-02 ENCOUNTER — Ambulatory Visit: Admitting: Nurse Practitioner

## 2024-02-06 ENCOUNTER — Ambulatory Visit (INDEPENDENT_AMBULATORY_CARE_PROVIDER_SITE_OTHER): Admitting: Physician Assistant

## 2024-02-06 ENCOUNTER — Encounter: Payer: Self-pay | Admitting: Physician Assistant

## 2024-02-06 VITALS — BP 128/82 | HR 99 | Ht 64.0 in | Wt 215.0 lb

## 2024-02-06 DIAGNOSIS — N200 Calculus of kidney: Secondary | ICD-10-CM

## 2024-02-06 DIAGNOSIS — R310 Gross hematuria: Secondary | ICD-10-CM

## 2024-02-06 LAB — URINALYSIS, COMPLETE
Bilirubin, UA: NEGATIVE
Glucose, UA: NEGATIVE
Ketones, UA: NEGATIVE
Nitrite, UA: NEGATIVE
Protein,UA: NEGATIVE
RBC, UA: NEGATIVE
Specific Gravity, UA: 1.03 (ref 1.005–1.030)
Urobilinogen, Ur: 0.2 mg/dL (ref 0.2–1.0)
pH, UA: 6 (ref 5.0–7.5)

## 2024-02-06 LAB — MICROSCOPIC EXAMINATION: Epithelial Cells (non renal): >10 /HPF — AB (ref 0–10)

## 2024-02-06 MED ORDER — DOXYCYCLINE HYCLATE 100 MG PO CAPS: 100.0000 mg | ORAL_CAPSULE | Freq: Two times a day (BID) | ORAL | 0 refills | Status: AC

## 2024-02-06 NOTE — Progress Notes (Signed)
 02/06/2024 9:43 AM   Sherri Byrd 1960/10/21 [id number]  CC: Chief Complaint  Patient presents with   Hematuria   HPI: Sherri Byrd is a 63 y.o. female with PMH nephrolithiasis who presents today for evaluation of gross hematuria.   Today she reports gross hematuria and left flank pain 2 weeks ago that subsequently resolved.  She saw some gravel pass, and thinks her symptoms were due to a stone episode.  She still having some dysuria, and wants to make sure she does not have an infection.  She denies fever, chills, nausea, or vomiting.  CT stone study dated 08/26/2023 showed ~3 nonobstructing right renal stones measuring up to 2 mm.  No left nephrolithiasis.  In-office UA today positive for trace leukocytes; urine microscopy with 11-30 WBCs/HPF, >10 epithelial cells/hpf, and moderate bacteria.   PMH: Past Medical History:  Diagnosis Date   History of kidney stones    Kidney stone    PONV (postoperative nausea and vomiting)    Sepsis (HCC)     Surgical History: Past Surgical History:  Procedure Laterality Date   CYSTOSCOPY W/ RETROGRADES  02/01/2023   Procedure: CYSTOSCOPY WITH RETROGRADE PYELOGRAM;  Surgeon: Twylla Glendia BROCKS, MD;  Location: [facility];  Service: Urology;;   CYSTOSCOPY WITH STENT PLACEMENT Right 01/18/2023   Procedure: CYSTOSCOPY WITH RIGHT URETERAL STENT PLACEMENT;  Surgeon: Twylla Glendia BROCKS, MD;  Location: [facility];  Service: Urology;  Laterality: Right;   CYSTOSCOPY/URETEROSCOPY/HOLMIUM LASER/STENT PLACEMENT Right 02/01/2023   Procedure: CYSTOSCOPY/URETEROSCOPY/HOLMIUM LASER/STENT EXCHANGE;  Surgeon: Twylla Glendia BROCKS, MD;  Location: [facility];  Service: Urology;  Laterality: Right;   kidney stone remove     kidney stone stent Left     Home Medications:  Allergies as of 02/06/2024       Reactions   Prednisone Shortness Of Breath   tachycardia   Meloxicam Other (See Comments)   LE edema   Penicillin G Hives   Sulfa Antibiotics Hives   Dilaudid  [hydromorphone] Palpitations   heart racing        Medication List        Accurate as of February 06, 2024  9:43 AM. If you have any questions, ask your nurse or doctor.          acetaminophen  325 MG tablet Commonly known as: TYLENOL  Take 2 tablets (650 mg total) by mouth every 6 (six) hours as needed for fever, headache or mild pain (pain score 1-3).   ciprofloxacin  250 MG tablet Commonly known as: Cipro  Take 1 tablet (250 mg total) by mouth 2 (two) times daily.   silodosin  8 MG Caps capsule Commonly known as: RAPAFLO  Take 1 capsule (8 mg total) by mouth daily with breakfast.        Allergies:  Allergies  Allergen Reactions   Prednisone Shortness Of Breath    tachycardia   Meloxicam Other (See Comments)    LE edema   Penicillin G Hives   Sulfa Antibiotics Hives   Dilaudid [Hydromorphone] Palpitations    heart racing    Family History: Family History  Problem Relation Age of Onset   Breast cancer Maternal Aunt 32   Breast cancer Cousin 48       maternal    Social History:   reports that she has never smoked. She has never used smokeless tobacco. She reports that she does not currently use alcohol. She reports that she does not use drugs.  Physical Exam: BP 128/82   Pulse 99  Ht 5' 4 (1.626 m)   Wt 215 lb (97.5 kg)   BMI 36.90 kg/m   Constitutional:  Alert and oriented, no acute distress, nontoxic appearing HEENT: [place], AT Cardiovascular: No clubbing, cyanosis, or edema Respiratory: Normal respiratory effort, no increased work of breathing Skin: No rashes, bruises or suspicious lesions Neurologic: Grossly intact, no focal deficits, moving all 4 extremities Psychiatric: Normal mood and affect  Laboratory Data: Results for orders placed or performed in visit on 02/06/24  Microscopic Examination   Collection Time: 02/06/24  9:09 AM   Urine  Result Value Ref Range   WBC, UA 11-30 (A) 0 - 5 /hpf   RBC, Urine 0-2 0 - 2 /hpf   Epithelial Cells  (non renal) >10 (A) 0 - 10 /hpf   Mucus, UA Present (A) Not Estab.   Bacteria, UA Moderate (A) None seen/Few  Urinalysis, Complete   Collection Time: 02/06/24  9:09 AM  Result Value Ref Range   Specific Gravity, UA 1.030 1.005 - 1.030   pH, UA 6.0 5.0 - 7.5   Color, UA Yellow Yellow   Appearance Ur Clear Clear   Leukocytes,UA Trace (A) Negative   Protein,UA Negative Negative/Trace   Glucose, UA Negative Negative   Ketones, UA Negative Negative   RBC, UA Negative Negative   Bilirubin, UA Negative Negative   Urobilinogen, Ur 0.2 0.2 - 1.0 mg/dL   Nitrite, UA Negative Negative   Microscopic Examination See below:    Assessment & Plan:   1. Gross hematuria (Primary) Hematuria resolved.  UA notable for pyuria and bacteriuria, though it is contaminated with squames.  Will start empiric Doxy and send for culture for further evaluation. - Urinalysis, Complete - CULTURE, URINE COMPREHENSIVE - doxycycline (VIBRAMYCIN) 100 MG capsule; Take 1 capsule (100 mg total) by mouth 2 (two) times daily with a meal for 7 days.  Dispense: 14 capsule; Refill: 0  2. Nephrolithiasis Possible stone episode causing #1 above, though interestingly her discomfort was on the contralateral side to her known stones.  Most recent imaging shows very small stones, so high likelihood of spontaneous passage if this was the source of her bleeding.  We discussed that if her symptoms persist despite antibiotics or if she has recurrent gross hematuria, she should come back and we will pursue additional imaging.  Return if symptoms worsen or fail to improve.  Lucie Hones, PA-C  [facility] Urology [place] [address] [place], [place] [address] [phone]

## 2024-02-09 LAB — CULTURE, URINE COMPREHENSIVE

## 2024-02-10 ENCOUNTER — Ambulatory Visit: Payer: Self-pay | Admitting: Physician Assistant

## 2024-02-15 ENCOUNTER — Telehealth: Payer: Self-pay | Admitting: Internal Medicine

## 2024-02-15 NOTE — Telephone Encounter (Signed)
 Lm and sent MyChart Message:  [facility]: Chelsea Aurora will be out of the office on December 18th.  Please call the office or reschedule your appointment through MyChart.  [id number] please reschedule

## 2024-03-08 ENCOUNTER — Ambulatory Visit: Admitting: Nurse Practitioner

## 2024-05-04 ENCOUNTER — Ambulatory Visit: Admitting: Nurse Practitioner

## 2024-06-18 ENCOUNTER — Ambulatory Visit: Admitting: Urology
